# Patient Record
Sex: Male | Born: 1960
Health system: Southern US, Community
[De-identification: ages and names within clinical notes are randomized; demographics above are authoritative.]

## PROBLEM LIST (undated history)

## (undated) DIAGNOSIS — I341 Nonrheumatic mitral (valve) prolapse: Secondary | ICD-10-CM

## (undated) DIAGNOSIS — I38 Endocarditis, valve unspecified: Secondary | ICD-10-CM

## (undated) DIAGNOSIS — Z87442 Personal history of urinary calculi: Secondary | ICD-10-CM

## (undated) DIAGNOSIS — H409 Unspecified glaucoma: Secondary | ICD-10-CM

## (undated) DIAGNOSIS — Z9889 Other specified postprocedural states: Secondary | ICD-10-CM

## (undated) DIAGNOSIS — I4891 Unspecified atrial fibrillation: Secondary | ICD-10-CM

## (undated) DIAGNOSIS — I340 Nonrheumatic mitral (valve) insufficiency: Secondary | ICD-10-CM

## (undated) HISTORY — DX: Nonrheumatic mitral (valve) prolapse: I34.1

## (undated) HISTORY — PX: CARDIAC CATHETERIZATION: SHX172

## (undated) HISTORY — DX: Nonrheumatic mitral (valve) insufficiency: I34.0

## (undated) HISTORY — PX: TONSILLECTOMY: SUR1361

---
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
## 2008-01-15 ENCOUNTER — Encounter: Admission: RE | Admit: 2008-01-15 | Discharge: 2008-01-15 | Payer: Self-pay | Admitting: Internal Medicine

## 2008-02-20 ENCOUNTER — Encounter: Admission: RE | Admit: 2008-02-20 | Discharge: 2008-02-20 | Payer: Self-pay | Admitting: Internal Medicine

## 2008-02-20 ENCOUNTER — Other Ambulatory Visit: Admission: RE | Admit: 2008-02-20 | Discharge: 2008-02-20 | Payer: Self-pay | Admitting: Diagnostic Radiology

## 2008-02-20 ENCOUNTER — Encounter (INDEPENDENT_AMBULATORY_CARE_PROVIDER_SITE_OTHER): Payer: Self-pay | Admitting: Diagnostic Radiology

## 2012-04-13 ENCOUNTER — Emergency Department (HOSPITAL_COMMUNITY)

## 2012-04-13 ENCOUNTER — Emergency Department (HOSPITAL_COMMUNITY)
Admission: EM | Admit: 2012-04-13 | Discharge: 2012-04-13 | Disposition: A | Discharge status: Home / Self Care | Attending: Emergency Medicine | Admitting: Emergency Medicine

## 2012-04-13 ENCOUNTER — Encounter (HOSPITAL_COMMUNITY): Payer: Self-pay | Admitting: *Deleted

## 2012-04-13 DIAGNOSIS — S46919A Strain of unspecified muscle, fascia and tendon at shoulder and upper arm level, unspecified arm, initial encounter: Secondary | ICD-10-CM

## 2012-04-13 DIAGNOSIS — S4980XA Other specified injuries of shoulder and upper arm, unspecified arm, initial encounter: Secondary | ICD-10-CM | POA: Insufficient documentation

## 2012-04-13 DIAGNOSIS — S46909A Unspecified injury of unspecified muscle, fascia and tendon at shoulder and upper arm level, unspecified arm, initial encounter: Secondary | ICD-10-CM | POA: Insufficient documentation

## 2012-04-13 DIAGNOSIS — X500XXA Overexertion from strenuous movement or load, initial encounter: Secondary | ICD-10-CM | POA: Insufficient documentation

## 2012-04-13 HISTORY — DX: Endocarditis, valve unspecified: I38

## 2012-04-13 HISTORY — DX: Unspecified glaucoma: H40.9

## 2012-04-13 MED ORDER — HYDROCODONE-ACETAMINOPHEN 5-325 MG PO TABS: 1.0000 | ORAL_TABLET | Freq: Four times a day (QID) | ORAL | Status: DC | PRN

## 2012-04-13 NOTE — ED Notes (Signed)
Pt states he was pushing something at work and felt pain in his right shoulder; since then has had pain with raising arm and with any pressure

## 2012-04-13 NOTE — ED Provider Notes (Signed)
History     CSN: [id number]  Arrival date & time 04/13/12  8295   First MD Initiated Contact with Patient 04/13/12 0315      Chief Complaint  Patient presents with  . 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 He had sudden onset of severe aching pain in R shoulder, worse with range of motion. No falls or direct trauma.   Past Medical History  Diagnosis Date  . Heart valve problem   . Glaucoma     History reviewed. No pertinent past surgical history.  No family history on file.  History  Substance Use Topics  . Smoking status: Never Smoker   . Smokeless tobacco: Not on file  . Alcohol Use: No      Review of Systems All other systems reviewed and are negative except as noted in HPI.   Allergies  Review of patient's allergies indicates no known allergies.  Home Medications   Current Outpatient Rx  Name Route Sig Dispense Refill  . LATANOPROST 0.005 % OP SOLN Both Eyes Place 1 drop into both eyes at bedtime.      BP 139/86  Pulse 58  Temp 98.3 F (36.8 C) (Oral)  Resp 18  SpO2 100%  Physical Exam  Nursing note and vitals reviewed. Constitutional: He is oriented to person, place, and time. He appears well-developed and well-nourished.  HENT:  Head: Normocephalic and atraumatic.  Eyes: EOM are normal. Pupils are equal, round, and reactive to light.  Neck: Normal range of motion. Neck supple.  Cardiovascular: Normal rate, normal heart sounds and intact distal pulses.   Pulmonary/Chest: Effort normal and breath sounds normal.  Abdominal: Bowel sounds are normal. He exhibits no distension. There is no tenderness.  Musculoskeletal: He exhibits tenderness (no bony tenderness in R shoulder.). He exhibits no edema.       ROM limited by pain, unable to abduct arm beyond shoulder level  Neurological: He is  alert and oriented to person, place, and time. He has normal strength. No cranial nerve deficit or sensory deficit.  Skin: Skin is warm and dry. No rash noted.  Psychiatric: He has a normal mood and affect.    ED Course  Procedures (including critical care time)  Labs Reviewed - No data to display Dg Shoulder Right  04/13/2012  *RADIOLOGY REPORT*  Clinical Data: Shoulder injury with pain.  RIGHT SHOULDER - 2+ VIEW  Comparison: None.  Findings: Three-view study shows no evidence for fracture. Acromioclavicular and coracoclavicular distances are preserved.  No worrisome lytic or sclerotic osseous abnormality.  IMPRESSION: Normal exam.   Original Report Authenticated By: ERIC A. MANSELL, M.D.      No diagnosis found.    MDM  Likely ligamentous injury. Xray neg. Placed in sling, advised outpatient ortho followup.         Charles B. Bernette Mayers, MD 04/13/12 [phone]

## 2016-01-29 DIAGNOSIS — I059 Rheumatic mitral valve disease, unspecified: Secondary | ICD-10-CM | POA: Diagnosis not present

## 2016-01-29 DIAGNOSIS — R0602 Shortness of breath: Secondary | ICD-10-CM | POA: Diagnosis not present

## 2016-02-10 DIAGNOSIS — H401211 Low-tension glaucoma, right eye, mild stage: Secondary | ICD-10-CM | POA: Diagnosis not present

## 2016-02-10 DIAGNOSIS — H401222 Low-tension glaucoma, left eye, moderate stage: Secondary | ICD-10-CM | POA: Diagnosis not present

## 2016-02-16 DIAGNOSIS — I341 Nonrheumatic mitral (valve) prolapse: Secondary | ICD-10-CM | POA: Diagnosis not present

## 2016-02-16 DIAGNOSIS — R0602 Shortness of breath: Secondary | ICD-10-CM | POA: Diagnosis not present

## 2016-02-18 DIAGNOSIS — R0602 Shortness of breath: Secondary | ICD-10-CM | POA: Diagnosis not present

## 2016-02-18 DIAGNOSIS — I341 Nonrheumatic mitral (valve) prolapse: Secondary | ICD-10-CM | POA: Diagnosis not present

## 2016-03-22 DIAGNOSIS — Z85828 Personal history of other malignant neoplasm of skin: Secondary | ICD-10-CM | POA: Diagnosis not present

## 2016-03-22 DIAGNOSIS — B078 Other viral warts: Secondary | ICD-10-CM | POA: Diagnosis not present

## 2016-03-22 DIAGNOSIS — D1801 Hemangioma of skin and subcutaneous tissue: Secondary | ICD-10-CM | POA: Diagnosis not present

## 2016-03-22 DIAGNOSIS — L570 Actinic keratosis: Secondary | ICD-10-CM | POA: Diagnosis not present

## 2016-06-17 DIAGNOSIS — Z125 Encounter for screening for malignant neoplasm of prostate: Secondary | ICD-10-CM | POA: Diagnosis not present

## 2016-06-17 DIAGNOSIS — R635 Abnormal weight gain: Secondary | ICD-10-CM | POA: Diagnosis not present

## 2016-06-17 DIAGNOSIS — Z0000 Encounter for general adult medical examination without abnormal findings: Secondary | ICD-10-CM | POA: Diagnosis not present

## 2016-06-22 DIAGNOSIS — R3129 Other microscopic hematuria: Secondary | ICD-10-CM | POA: Diagnosis not present

## 2016-06-22 DIAGNOSIS — Z23 Encounter for immunization: Secondary | ICD-10-CM | POA: Diagnosis not present

## 2016-06-22 DIAGNOSIS — Z0000 Encounter for general adult medical examination without abnormal findings: Secondary | ICD-10-CM | POA: Diagnosis not present

## 2016-08-04 DIAGNOSIS — R3121 Asymptomatic microscopic hematuria: Secondary | ICD-10-CM | POA: Diagnosis not present

## 2016-08-09 DIAGNOSIS — R3121 Asymptomatic microscopic hematuria: Secondary | ICD-10-CM | POA: Diagnosis not present

## 2016-08-09 DIAGNOSIS — N200 Calculus of kidney: Secondary | ICD-10-CM | POA: Diagnosis not present

## 2016-08-16 DIAGNOSIS — H524 Presbyopia: Secondary | ICD-10-CM | POA: Diagnosis not present

## 2016-08-16 DIAGNOSIS — H401222 Low-tension glaucoma, left eye, moderate stage: Secondary | ICD-10-CM | POA: Diagnosis not present

## 2016-08-16 DIAGNOSIS — H401211 Low-tension glaucoma, right eye, mild stage: Secondary | ICD-10-CM | POA: Diagnosis not present

## 2016-08-16 DIAGNOSIS — H25043 Posterior subcapsular polar age-related cataract, bilateral: Secondary | ICD-10-CM | POA: Diagnosis not present

## 2016-08-19 DIAGNOSIS — I340 Nonrheumatic mitral (valve) insufficiency: Secondary | ICD-10-CM | POA: Diagnosis not present

## 2016-08-19 DIAGNOSIS — I059 Rheumatic mitral valve disease, unspecified: Secondary | ICD-10-CM | POA: Diagnosis not present

## 2016-08-30 DIAGNOSIS — I059 Rheumatic mitral valve disease, unspecified: Secondary | ICD-10-CM | POA: Diagnosis not present

## 2016-08-30 DIAGNOSIS — R0602 Shortness of breath: Secondary | ICD-10-CM | POA: Diagnosis not present

## 2016-09-22 DIAGNOSIS — I059 Rheumatic mitral valve disease, unspecified: Secondary | ICD-10-CM | POA: Diagnosis not present

## 2016-09-29 DIAGNOSIS — R3121 Asymptomatic microscopic hematuria: Secondary | ICD-10-CM | POA: Diagnosis not present

## 2016-09-29 DIAGNOSIS — N200 Calculus of kidney: Secondary | ICD-10-CM | POA: Diagnosis not present

## 2016-11-01 DIAGNOSIS — L570 Actinic keratosis: Secondary | ICD-10-CM | POA: Diagnosis not present

## 2017-02-21 DIAGNOSIS — H401222 Low-tension glaucoma, left eye, moderate stage: Secondary | ICD-10-CM | POA: Diagnosis not present

## 2017-02-21 DIAGNOSIS — H401211 Low-tension glaucoma, right eye, mild stage: Secondary | ICD-10-CM | POA: Diagnosis not present

## 2017-02-22 DIAGNOSIS — I059 Rheumatic mitral valve disease, unspecified: Secondary | ICD-10-CM | POA: Diagnosis not present

## 2017-03-07 DIAGNOSIS — R0602 Shortness of breath: Secondary | ICD-10-CM | POA: Diagnosis not present

## 2017-03-07 DIAGNOSIS — Z792 Long term (current) use of antibiotics: Secondary | ICD-10-CM | POA: Diagnosis not present

## 2017-03-07 DIAGNOSIS — I341 Nonrheumatic mitral (valve) prolapse: Secondary | ICD-10-CM | POA: Diagnosis not present

## 2017-03-23 DIAGNOSIS — L237 Allergic contact dermatitis due to plants, except food: Secondary | ICD-10-CM | POA: Diagnosis not present

## 2017-03-23 DIAGNOSIS — L723 Sebaceous cyst: Secondary | ICD-10-CM | POA: Diagnosis not present

## 2017-03-23 DIAGNOSIS — Z85828 Personal history of other malignant neoplasm of skin: Secondary | ICD-10-CM | POA: Diagnosis not present

## 2017-03-23 DIAGNOSIS — L570 Actinic keratosis: Secondary | ICD-10-CM | POA: Diagnosis not present

## 2017-03-23 DIAGNOSIS — L918 Other hypertrophic disorders of the skin: Secondary | ICD-10-CM | POA: Diagnosis not present

## 2017-03-28 DIAGNOSIS — N200 Calculus of kidney: Secondary | ICD-10-CM | POA: Diagnosis not present

## 2017-03-28 DIAGNOSIS — R3121 Asymptomatic microscopic hematuria: Secondary | ICD-10-CM | POA: Diagnosis not present

## 2017-06-22 DIAGNOSIS — Z0000 Encounter for general adult medical examination without abnormal findings: Secondary | ICD-10-CM | POA: Diagnosis not present

## 2017-06-22 DIAGNOSIS — Z125 Encounter for screening for malignant neoplasm of prostate: Secondary | ICD-10-CM | POA: Diagnosis not present

## 2017-06-28 DIAGNOSIS — Z125 Encounter for screening for malignant neoplasm of prostate: Secondary | ICD-10-CM | POA: Diagnosis not present

## 2017-07-07 DIAGNOSIS — R3129 Other microscopic hematuria: Secondary | ICD-10-CM | POA: Diagnosis not present

## 2017-07-07 DIAGNOSIS — Z23 Encounter for immunization: Secondary | ICD-10-CM | POA: Diagnosis not present

## 2017-07-07 DIAGNOSIS — Z0000 Encounter for general adult medical examination without abnormal findings: Secondary | ICD-10-CM | POA: Diagnosis not present

## 2017-07-08 DIAGNOSIS — R740 Nonspecific elevation of levels of transaminase and lactic acid dehydrogenase [LDH]: Secondary | ICD-10-CM | POA: Diagnosis not present

## 2017-08-04 DIAGNOSIS — R740 Nonspecific elevation of levels of transaminase and lactic acid dehydrogenase [LDH]: Secondary | ICD-10-CM | POA: Diagnosis not present

## 2017-08-05 DIAGNOSIS — K7689 Other specified diseases of liver: Secondary | ICD-10-CM | POA: Diagnosis not present

## 2017-08-19 DIAGNOSIS — K7689 Other specified diseases of liver: Secondary | ICD-10-CM | POA: Diagnosis not present

## 2017-08-19 DIAGNOSIS — M542 Cervicalgia: Secondary | ICD-10-CM | POA: Diagnosis not present

## 2017-08-19 DIAGNOSIS — I10 Essential (primary) hypertension: Secondary | ICD-10-CM | POA: Diagnosis not present

## 2017-08-29 DIAGNOSIS — H401222 Low-tension glaucoma, left eye, moderate stage: Secondary | ICD-10-CM | POA: Diagnosis not present

## 2017-08-29 DIAGNOSIS — H25043 Posterior subcapsular polar age-related cataract, bilateral: Secondary | ICD-10-CM | POA: Diagnosis not present

## 2017-08-29 DIAGNOSIS — H401211 Low-tension glaucoma, right eye, mild stage: Secondary | ICD-10-CM | POA: Diagnosis not present

## 2017-08-29 DIAGNOSIS — H524 Presbyopia: Secondary | ICD-10-CM | POA: Diagnosis not present

## 2017-08-30 DIAGNOSIS — I341 Nonrheumatic mitral (valve) prolapse: Secondary | ICD-10-CM | POA: Diagnosis not present

## 2017-09-05 DIAGNOSIS — R0602 Shortness of breath: Secondary | ICD-10-CM | POA: Diagnosis not present

## 2017-09-05 DIAGNOSIS — I341 Nonrheumatic mitral (valve) prolapse: Secondary | ICD-10-CM | POA: Diagnosis not present

## 2017-12-22 DIAGNOSIS — M545 Low back pain: Secondary | ICD-10-CM | POA: Diagnosis not present

## 2018-02-13 DIAGNOSIS — I10 Essential (primary) hypertension: Secondary | ICD-10-CM | POA: Diagnosis not present

## 2018-02-16 DIAGNOSIS — J45909 Unspecified asthma, uncomplicated: Secondary | ICD-10-CM | POA: Diagnosis not present

## 2018-02-16 DIAGNOSIS — J301 Allergic rhinitis due to pollen: Secondary | ICD-10-CM | POA: Diagnosis not present

## 2018-02-16 DIAGNOSIS — Z0000 Encounter for general adult medical examination without abnormal findings: Secondary | ICD-10-CM | POA: Diagnosis not present

## 2018-02-16 DIAGNOSIS — I10 Essential (primary) hypertension: Secondary | ICD-10-CM | POA: Diagnosis not present

## 2018-02-27 DIAGNOSIS — H401211 Low-tension glaucoma, right eye, mild stage: Secondary | ICD-10-CM | POA: Diagnosis not present

## 2018-02-27 DIAGNOSIS — H401222 Low-tension glaucoma, left eye, moderate stage: Secondary | ICD-10-CM | POA: Diagnosis not present

## 2018-03-03 DIAGNOSIS — I341 Nonrheumatic mitral (valve) prolapse: Secondary | ICD-10-CM | POA: Diagnosis not present

## 2018-03-03 DIAGNOSIS — R0602 Shortness of breath: Secondary | ICD-10-CM | POA: Diagnosis not present

## 2018-03-27 DIAGNOSIS — N200 Calculus of kidney: Secondary | ICD-10-CM | POA: Diagnosis not present

## 2018-03-27 DIAGNOSIS — N400 Enlarged prostate without lower urinary tract symptoms: Secondary | ICD-10-CM | POA: Diagnosis not present

## 2018-03-29 DIAGNOSIS — L814 Other melanin hyperpigmentation: Secondary | ICD-10-CM | POA: Diagnosis not present

## 2018-03-29 DIAGNOSIS — Z85828 Personal history of other malignant neoplasm of skin: Secondary | ICD-10-CM | POA: Diagnosis not present

## 2018-03-29 DIAGNOSIS — L570 Actinic keratosis: Secondary | ICD-10-CM | POA: Diagnosis not present

## 2018-03-29 DIAGNOSIS — L718 Other rosacea: Secondary | ICD-10-CM | POA: Diagnosis not present

## 2018-05-29 DIAGNOSIS — H401211 Low-tension glaucoma, right eye, mild stage: Secondary | ICD-10-CM | POA: Diagnosis not present

## 2018-05-29 DIAGNOSIS — H401222 Low-tension glaucoma, left eye, moderate stage: Secondary | ICD-10-CM | POA: Diagnosis not present

## 2018-08-01 ENCOUNTER — Other Ambulatory Visit: Payer: Self-pay | Admitting: Cardiology

## 2018-08-01 DIAGNOSIS — I340 Nonrheumatic mitral (valve) insufficiency: Secondary | ICD-10-CM

## 2018-08-16 ENCOUNTER — Ambulatory Visit: Payer: Federal, State, Local not specified - PPO

## 2018-08-16 ENCOUNTER — Other Ambulatory Visit: Payer: Self-pay

## 2018-08-16 DIAGNOSIS — I340 Nonrheumatic mitral (valve) insufficiency: Secondary | ICD-10-CM

## 2018-08-21 DIAGNOSIS — I10 Essential (primary) hypertension: Secondary | ICD-10-CM | POA: Diagnosis not present

## 2018-08-21 DIAGNOSIS — Z125 Encounter for screening for malignant neoplasm of prostate: Secondary | ICD-10-CM | POA: Diagnosis not present

## 2018-08-22 DIAGNOSIS — Z298 Encounter for other specified prophylactic measures: Secondary | ICD-10-CM | POA: Insufficient documentation

## 2018-08-22 DIAGNOSIS — R0600 Dyspnea, unspecified: Secondary | ICD-10-CM | POA: Insufficient documentation

## 2018-08-22 DIAGNOSIS — R0609 Other forms of dyspnea: Secondary | ICD-10-CM | POA: Insufficient documentation

## 2018-08-22 DIAGNOSIS — I340 Nonrheumatic mitral (valve) insufficiency: Secondary | ICD-10-CM | POA: Insufficient documentation

## 2018-08-22 DIAGNOSIS — I341 Nonrheumatic mitral (valve) prolapse: Secondary | ICD-10-CM | POA: Insufficient documentation

## 2018-08-22 NOTE — H&P (View-Only) (Signed)
Subjective:   @Patient  ID@: Derrick Craig, male    DOB: 1961-01-28, 58 y.o.   MRN: [id number]  Pearson GrippeKim, James, MD:  No chief complaint on file.   HPI   Derrick Craig is a pleasant 58 year old Caucasian male with history of mitral valve prolapse and moderate to severe MR that is essentially asymptomatic.  Recently underwent echocardiogram on 08/15/2018 that was unchanged from before, except for now LV dilation.Patient is here for 6 month office visit for follow up on mitral regurgitation and also to discuss recent echo results.   He states that he is been doing well and has not had any significant symptoms. He continues to have mild exertional dyspnea with extremes of exertion while lifting heavy objects at work place. No PND or orthopnea. He denies any chest pain or palpitations. No fever, chills.Reports gaining weight since his Aunt passed away.   Past Medical History:  Diagnosis Date  . Glaucoma   . Heart valve problem   . MVP (mitral valve prolapse)     Past Surgical History:  Procedure Laterality Date  . TONSILLECTOMY      Family History  Problem Relation Age of Onset  . Heart failure Mother     Social History   Socioeconomic History  . Marital status: Single    Spouse name: Not on file  . Number of children: Not on file  . Years of education: Not on file  . Highest education level: Not on file  Occupational History  . Not on file  Social Needs  . Financial resource strain: Not on file  . Food insecurity:    Worry: Not on file    Inability: Not on file  . Transportation needs:    Medical: Not on file    Non-medical: Not on file  Tobacco Use  . Smoking status: Never Smoker  . Smokeless tobacco: Never Used  Substance and Sexual Activity  . Alcohol use: No  . Drug use: Not on file  . Sexual activity: Not on file  Lifestyle  . Physical activity:    Days per week: Not on file    Minutes per session: Not on file  . Stress: Not on file    Relationships  . Social connections:    Talks on phone: Not on file    Gets together: Not on file    Attends religious service: Not on file    Active member of club or organization: Not on file    Attends meetings of clubs or organizations: Not on file    Relationship status: Not on file  . Intimate partner violence:    Fear of current or ex partner: Not on file    Emotionally abused: Not on file    Physically abused: Not on file    Forced sexual activity: Not on file  Other Topics Concern  . Not on file  Social History Narrative  . Not on file    Current Outpatient Medications on File Prior to Visit  Medication Sig Dispense Refill  . HYDROcodone-acetaminophen (NORCO/VICODIN) 5-325 MG per tablet Take 1-2 tablets by mouth every 6 (six) hours as needed for pain. 30 tablet 0  . latanoprost (XALATAN) 0.005 % ophthalmic solution Place 1 drop into both eyes at bedtime.     No current facility-administered medications on file prior to visit.      Review of Systems  Constitution: Negative for decreased appetite, malaise/fatigue, weight gain and weight loss.  Eyes:  Negative for visual disturbance.  Cardiovascular: Positive for dyspnea on exertion (with extreme exertion). Negative for chest pain, claudication, leg swelling, orthopnea, palpitations and syncope.  Respiratory: Negative for hemoptysis and wheezing.   Endocrine: Negative for cold intolerance and heat intolerance.  Hematologic/Lymphatic: Does not bruise/bleed easily.  Skin: Negative for nail changes.  Musculoskeletal: Negative for muscle weakness and myalgias.  Gastrointestinal: Negative for abdominal pain, change in bowel habit, nausea and vomiting.  Neurological: Negative for difficulty with concentration, dizziness, focal weakness and headaches.  Psychiatric/Behavioral: Negative for altered mental status and suicidal ideas.  All other systems reviewed and are negative.      Objective:     Blood pressure 111/69,  pulse (!) 58, height 6' (1.829 m), weight 205 lb 1.6 oz (93 kg), SpO2 99 %.   Echocardiogram 08/15/2018: Left ventricle cavity is mildly dilated at 6.1 cm and LVIDS is normal at 41.cm. Mild concentric hypertrophy of the left ventricle. Normal global wall motion. Normal diastolic filling pattern. Calculated EF 61%. Left atrial cavity is modearely dilated by volume at 52mL and measures 4cm in long axis. The interatrial Septum is thin and mobile but appears to be intact by 2D and CF Doppler interrogation. Trace aortic regurgitation. Moderately severe posteriorly directed (Grade III) mitral regurgitation. Moderate mitral valve prolapse with symmetric leaflets. Mild Myxomatous degeneration of the MV leaflets. Trace tricuspid regurgitation. Inadequate tricuspid regurgitation jet to estimate pulmonary artery pressure. To the study done on 08/30/2017: Left ventricle is now mildly dilated compared to normal.  Treadmill stress test [02/16/2016]: Indications: Screening for CAD, Mitral regurgitation. The resting electrocardiogram demonstrated normal sinus rhythm, normal resting conduction, no resting arrhythmias and normal rest repolarization. The stress electrocardiogram was normal. There were no significant arrhythmias. Rare PVC. Patient exercised on Bruce protocol for 13.00 minutes and achieved 114 % of Max Predicted HR (Target HR was >85% MPHR) and 14.16 METS. Stress symptoms included fatigue. Normal BP response. Exercise capacity was excellent . Impression: Normal stress EKG. No significant arrhythmias. Normal BP response.   Physical Exam  Constitutional: He is oriented to person, place, and time. Vital signs are normal. He appears well-developed and well-nourished.  HENT:  Head: Normocephalic and atraumatic.  Neck: Normal range of motion.  Cardiovascular: Normal rate, regular rhythm and intact distal pulses.  Murmur heard. High-pitched blowing holosystolic murmur is present at the apex.  Mid-systolic click Pulmonary/Chest: Effort normal and breath sounds normal. No accessory muscle usage. No respiratory distress.  Abdominal: Soft. Bowel sounds are normal.  Musculoskeletal: Normal range of motion.  Neurological: He is alert and oriented to person, place, and time.  Skin: Skin is warm and dry.  Vitals reviewed.          Assessment & Recommendations:   1. Severe mitral valve regurgitation Symptomatically has minimal symptoms that are stable; however, in view of LV dilation that is new, feel that it would be best to proceed with MV replacement. This was discussed in detail with the patient. I will refer to CV surgery for minimally invasive procedure unless he is not a candidate. He will need TEE to confirm severity and for further evaluation. Will also schedule for right and left heart catheterization for pre-surgery evaluation to ensure no significant CAD. Schedule for cardiac catheterization, and possible angioplasty. We discussed regarding risks, benefits, alternatives to this including stress testing, CTA and continued medical therapy. Patient wants to proceed. Understands <1-2% risk of death, stroke, MI, urgent CABG, bleeding, infection, renal failure but not limited to these.  Will  arrange for after TEE.   2. Mitral valve prolapse As stated above, will need mitral valve replacement and will place referral to CV surgery.   3. Dyspnea on exertion Stable. Occurs with extreme exertion.  4. Indication present for endocarditis prophylaxis He is aware of need for antibiotic prophylaxis.   I will see him back after surgery for follow up.   *I have discussed this case with Dr. Jacinto Halim and he personally examined the patient and participated in formulating the plan.*    Altamese Las Lomas, FNP-C [facility] Office: [phone] Fax: [phone]

## 2018-08-22 NOTE — Progress Notes (Signed)
    Subjective:   @Patient ID@: Derrick Craig, male    DOB: 10/24/1960, 58 y.o.   MRN: [id number]  Kim, James, MD:  No chief complaint on file.   HPI   Derrick Craig is a pleasant 58 year old Caucasian male with history of mitral valve prolapse and moderate to severe MR that is essentially asymptomatic.  Recently underwent echocardiogram on 08/15/2018 that was unchanged from before, except for now LV dilation.Patient is here for 6 month office visit for follow up on mitral regurgitation and also to discuss recent echo results.   He states that he is been doing well and has not had any significant symptoms. He continues to have mild exertional dyspnea with extremes of exertion while lifting heavy objects at work place. No PND or orthopnea. He denies any chest pain or palpitations. No fever, chills.Reports gaining weight since his Aunt passed away.   Past Medical History:  Diagnosis Date  . Glaucoma   . Heart valve problem   . MVP (mitral valve prolapse)     Past Surgical History:  Procedure Laterality Date  . TONSILLECTOMY      Family History  Problem Relation Age of Onset  . Heart failure Mother     Social History   Socioeconomic History  . Marital status: Single    Spouse name: Not on file  . Number of children: Not on file  . Years of education: Not on file  . Highest education level: Not on file  Occupational History  . Not on file  Social Needs  . Financial resource strain: Not on file  . Food insecurity:    Worry: Not on file    Inability: Not on file  . Transportation needs:    Medical: Not on file    Non-medical: Not on file  Tobacco Use  . Smoking status: Never Smoker  . Smokeless tobacco: Never Used  Substance and Sexual Activity  . Alcohol use: No  . Drug use: Not on file  . Sexual activity: Not on file  Lifestyle  . Physical activity:    Days per week: Not on file    Minutes per session: Not on file  . Stress: Not on file    Relationships  . Social connections:    Talks on phone: Not on file    Gets together: Not on file    Attends religious service: Not on file    Active member of club or organization: Not on file    Attends meetings of clubs or organizations: Not on file    Relationship status: Not on file  . Intimate partner violence:    Fear of current or ex partner: Not on file    Emotionally abused: Not on file    Physically abused: Not on file    Forced sexual activity: Not on file  Other Topics Concern  . Not on file  Social History Narrative  . Not on file    Current Outpatient Medications on File Prior to Visit  Medication Sig Dispense Refill  . HYDROcodone-acetaminophen (NORCO/VICODIN) 5-325 MG per tablet Take 1-2 tablets by mouth every 6 (six) hours as needed for pain. 30 tablet 0  . latanoprost (XALATAN) 0.005 % ophthalmic solution Place 1 drop into both eyes at bedtime.     No current facility-administered medications on file prior to visit.      Review of Systems  Constitution: Negative for decreased appetite, malaise/fatigue, weight gain and weight loss.  Eyes:   Negative for visual disturbance.  Cardiovascular: Positive for dyspnea on exertion (with extreme exertion). Negative for chest pain, claudication, leg swelling, orthopnea, palpitations and syncope.  Respiratory: Negative for hemoptysis and wheezing.   Endocrine: Negative for cold intolerance and heat intolerance.  Hematologic/Lymphatic: Does not bruise/bleed easily.  Skin: Negative for nail changes.  Musculoskeletal: Negative for muscle weakness and myalgias.  Gastrointestinal: Negative for abdominal pain, change in bowel habit, nausea and vomiting.  Neurological: Negative for difficulty with concentration, dizziness, focal weakness and headaches.  Psychiatric/Behavioral: Negative for altered mental status and suicidal ideas.  All other systems reviewed and are negative.      Objective:     Blood pressure 111/69,  pulse (!) 58, height 6' (1.829 m), weight 205 lb 1.6 oz (93 kg), SpO2 99 %.   Echocardiogram 08/15/2018: Left ventricle cavity is mildly dilated at 6.1 cm and LVIDS is normal at 41.cm. Mild concentric hypertrophy of the left ventricle. Normal global wall motion. Normal diastolic filling pattern. Calculated EF 61%. Left atrial cavity is modearely dilated by volume at 52mL and measures 4cm in long axis. The interatrial Septum is thin and mobile but appears to be intact by 2D and CF Doppler interrogation. Trace aortic regurgitation. Moderately severe posteriorly directed (Grade III) mitral regurgitation. Moderate mitral valve prolapse with symmetric leaflets. Mild Myxomatous degeneration of the MV leaflets. Trace tricuspid regurgitation. Inadequate tricuspid regurgitation jet to estimate pulmonary artery pressure. To the study done on 08/30/2017: Left ventricle is now mildly dilated compared to normal.  Treadmill stress test [02/16/2016]: Indications: Screening for CAD, Mitral regurgitation. The resting electrocardiogram demonstrated normal sinus rhythm, normal resting conduction, no resting arrhythmias and normal rest repolarization. The stress electrocardiogram was normal. There were no significant arrhythmias. Rare PVC. Patient exercised on Bruce protocol for 13.00 minutes and achieved 114 % of Max Predicted HR (Target HR was >85% MPHR) and 14.16 METS. Stress symptoms included fatigue. Normal BP response. Exercise capacity was excellent . Impression: Normal stress EKG. No significant arrhythmias. Normal BP response.   Physical Exam  Constitutional: He is oriented to person, place, and time. Vital signs are normal. He appears well-developed and well-nourished.  HENT:  Head: Normocephalic and atraumatic.  Neck: Normal range of motion.  Cardiovascular: Normal rate, regular rhythm and intact distal pulses.  Murmur heard. High-pitched blowing holosystolic murmur is present at the apex.  Mid-systolic click Pulmonary/Chest: Effort normal and breath sounds normal. No accessory muscle usage. No respiratory distress.  Abdominal: Soft. Bowel sounds are normal.  Musculoskeletal: Normal range of motion.  Neurological: He is alert and oriented to person, place, and time.  Skin: Skin is warm and dry.  Vitals reviewed.          Assessment & Recommendations:   1. Severe mitral valve regurgitation Symptomatically has minimal symptoms that are stable; however, in view of LV dilation that is new, feel that it would be best to proceed with MV replacement. This was discussed in detail with the patient. I will refer to CV surgery for minimally invasive procedure unless he is not a candidate. He will need TEE to confirm severity and for further evaluation. Will also schedule for right and left heart catheterization for pre-surgery evaluation to ensure no significant CAD. Schedule for cardiac catheterization, and possible angioplasty. We discussed regarding risks, benefits, alternatives to this including stress testing, CTA and continued medical therapy. Patient wants to proceed. Understands <1-2% risk of death, stroke, MI, urgent CABG, bleeding, infection, renal failure but not limited to these.  Will   arrange for after TEE.   2. Mitral valve prolapse As stated above, will need mitral valve replacement and will place referral to CV surgery.   3. Dyspnea on exertion Stable. Occurs with extreme exertion.  4. Indication present for endocarditis prophylaxis He is aware of need for antibiotic prophylaxis.   I will see him back after surgery for follow up.   *I have discussed this case with Dr. Ganji and he personally examined the patient and participated in formulating the plan.*    Catalaya Garr, FNP-C [facility] Office: [phone] Fax: [phone] 

## 2018-08-23 ENCOUNTER — Encounter: Payer: Self-pay | Admitting: Cardiology

## 2018-08-23 ENCOUNTER — Ambulatory Visit: Payer: Federal, State, Local not specified - PPO | Admitting: Cardiology

## 2018-08-23 DIAGNOSIS — Z298 Encounter for other specified prophylactic measures: Secondary | ICD-10-CM | POA: Diagnosis not present

## 2018-08-23 DIAGNOSIS — R0600 Dyspnea, unspecified: Secondary | ICD-10-CM

## 2018-08-23 DIAGNOSIS — I341 Nonrheumatic mitral (valve) prolapse: Secondary | ICD-10-CM

## 2018-08-23 DIAGNOSIS — I340 Nonrheumatic mitral (valve) insufficiency: Secondary | ICD-10-CM

## 2018-08-23 DIAGNOSIS — R0609 Other forms of dyspnea: Secondary | ICD-10-CM

## 2018-08-28 ENCOUNTER — Other Ambulatory Visit: Payer: Self-pay | Admitting: Cardiology

## 2018-08-28 DIAGNOSIS — Z0000 Encounter for general adult medical examination without abnormal findings: Secondary | ICD-10-CM | POA: Diagnosis not present

## 2018-08-28 DIAGNOSIS — I341 Nonrheumatic mitral (valve) prolapse: Secondary | ICD-10-CM | POA: Diagnosis not present

## 2018-08-29 LAB — BASIC METABOLIC PANEL
BUN/Creatinine Ratio: 19 (ref 9–20)
BUN: 18 mg/dL (ref 6–24)
CALCIUM: 9.2 mg/dL (ref 8.7–10.2)
CO2: 25 mmol/L (ref 20–29)
CREATININE: 0.94 mg/dL (ref 0.76–1.27)
Chloride: 103 mmol/L (ref 96–106)
GFR calc Af Amer: 105 mL/min/{1.73_m2} (ref >59)
GFR, EST NON AFRICAN AMERICAN: 91 mL/min/{1.73_m2} (ref >59)
Glucose: 100 mg/dL — ABNORMAL HIGH (ref 65–99)
POTASSIUM: 4 mmol/L (ref 3.5–5.2)
Sodium: 141 mmol/L (ref 134–144)

## 2018-08-29 LAB — CBC WITH DIFFERENTIAL/PLATELET
Basophils Absolute: 0 10*3/uL (ref 0.0–0.2)
Basos: 1 %
EOS (ABSOLUTE): 0.2 10*3/uL (ref 0.0–0.4)
EOS: 3 %
HEMATOCRIT: 44.1 % (ref 37.5–51.0)
HEMOGLOBIN: 15.1 g/dL (ref 13.0–17.7)
IMMATURE GRANULOCYTES: 0 %
Immature Grans (Abs): 0 10*3/uL (ref 0.0–0.1)
Lymphocytes Absolute: 1.9 10*3/uL (ref 0.7–3.1)
Lymphs: 36 %
MCH: 32.3 pg (ref 26.6–33.0)
MCHC: 34.2 g/dL (ref 31.5–35.7)
MCV: 94 fL (ref 79–97)
MONOCYTES: 10 %
Monocytes Absolute: 0.5 10*3/uL (ref 0.1–0.9)
NEUTROS PCT: 50 %
Neutrophils Absolute: 2.6 10*3/uL (ref 1.4–7.0)
Platelets: 250 10*3/uL (ref 150–450)
RBC: 4.68 x10E6/uL (ref 4.14–5.80)
RDW: 12.6 % (ref 11.6–15.4)
WBC: 5.2 10*3/uL (ref 3.4–10.8)

## 2018-09-05 ENCOUNTER — Encounter (HOSPITAL_COMMUNITY)
Admission: RE | Disposition: A | Discharge status: Home / Self Care | Payer: Self-pay | Source: Home / Self Care | Attending: Cardiology

## 2018-09-05 ENCOUNTER — Ambulatory Visit (HOSPITAL_COMMUNITY)
Admission: RE | Admit: 2018-09-05 | Discharge: 2018-09-05 | Disposition: A | Discharge status: Home / Self Care | Payer: Federal, State, Local not specified - PPO | Attending: Cardiology | Admitting: Cardiology

## 2018-09-05 ENCOUNTER — Other Ambulatory Visit: Payer: Self-pay

## 2018-09-05 ENCOUNTER — Encounter (HOSPITAL_COMMUNITY): Payer: Self-pay | Admitting: *Deleted

## 2018-09-05 ENCOUNTER — Ambulatory Visit (HOSPITAL_COMMUNITY): Payer: Federal, State, Local not specified - PPO

## 2018-09-05 ENCOUNTER — Other Ambulatory Visit (HOSPITAL_COMMUNITY): Payer: Self-pay

## 2018-09-05 DIAGNOSIS — Q211 Atrial septal defect: Secondary | ICD-10-CM | POA: Insufficient documentation

## 2018-09-05 DIAGNOSIS — I420 Dilated cardiomyopathy: Secondary | ICD-10-CM | POA: Insufficient documentation

## 2018-09-05 DIAGNOSIS — I341 Nonrheumatic mitral (valve) prolapse: Secondary | ICD-10-CM | POA: Diagnosis present

## 2018-09-05 DIAGNOSIS — Z8249 Family history of ischemic heart disease and other diseases of the circulatory system: Secondary | ICD-10-CM | POA: Diagnosis not present

## 2018-09-05 DIAGNOSIS — I340 Nonrheumatic mitral (valve) insufficiency: Secondary | ICD-10-CM | POA: Diagnosis not present

## 2018-09-05 DIAGNOSIS — R0609 Other forms of dyspnea: Secondary | ICD-10-CM | POA: Diagnosis not present

## 2018-09-05 DIAGNOSIS — H409 Unspecified glaucoma: Secondary | ICD-10-CM | POA: Diagnosis not present

## 2018-09-05 HISTORY — PX: TEE WITHOUT CARDIOVERSION: SHX5443

## 2018-09-05 HISTORY — PX: RIGHT/LEFT HEART CATH AND CORONARY ANGIOGRAPHY: CATH118266

## 2018-09-05 LAB — POCT I-STAT EG7
Bicarbonate: 25.8 mmol/L (ref 20.0–28.0)
Bicarbonate: 26.1 mmol/L (ref 20.0–28.0)
Bicarbonate: 26.4 mmol/L (ref 20.0–28.0)
Bicarbonate: 25.8 mmol/L (ref 20.0–28.0)
CALCIUM ION: 1.21 mmol/L (ref 1.15–1.40)
Calcium, Ion: 1.19 mmol/L (ref 1.15–1.40)
Calcium, Ion: 1.21 mmol/L (ref 1.15–1.40)
Calcium, Ion: 1.11 mmol/L — ABNORMAL LOW (ref 1.15–1.40)
HCT: 38 % — ABNORMAL LOW (ref 39.0–52.0)
HCT: 39 % (ref 39.0–52.0)
HCT: 37 % — ABNORMAL LOW (ref 39.0–52.0)
HEMATOCRIT: 38 % — AB (ref 39.0–52.0)
HEMOGLOBIN: 12.9 g/dL — AB (ref 13.0–17.0)
HEMOGLOBIN: 12.6 g/dL — AB (ref 13.0–17.0)
Hemoglobin: 12.9 g/dL — ABNORMAL LOW (ref 13.0–17.0)
Hemoglobin: 13.3 g/dL (ref 13.0–17.0)
O2 Saturation: 76 %
O2 Saturation: 85 %
O2 Saturation: 98 %
O2 Saturation: 79 %
PO2 VEN: 44 mmHg (ref 32.0–45.0)
Potassium: 4.1 mmol/L (ref 3.5–5.1)
Potassium: 4.1 mmol/L (ref 3.5–5.1)
Potassium: 4.2 mmol/L (ref 3.5–5.1)
Potassium: 3.9 mmol/L (ref 3.5–5.1)
Sodium: 139 mmol/L (ref 135–145)
Sodium: 140 mmol/L (ref 135–145)
Sodium: 140 mmol/L (ref 135–145)
Sodium: 141 mmol/L (ref 135–145)
TCO2: 27 mmol/L (ref 22–32)
TCO2: 28 mmol/L (ref 22–32)
TCO2: 28 mmol/L (ref 22–32)
TCO2: 27 mmol/L (ref 22–32)
pCO2, Ven: 46.6 mmHg (ref 44.0–60.0)
pCO2, Ven: 48.1 mmHg (ref 44.0–60.0)
pCO2, Ven: 49 mmHg (ref 44.0–60.0)
pCO2, Ven: 47.7 mmHg (ref 44.0–60.0)
pH, Ven: 7.333 (ref 7.250–7.430)
pH, Ven: 7.348 (ref 7.250–7.430)
pH, Ven: 7.351 (ref 7.250–7.430)
pH, Ven: 7.341 (ref 7.250–7.430)
pO2, Ven: 102 mmHg — ABNORMAL HIGH (ref 32.0–45.0)
pO2, Ven: 53 mmHg — ABNORMAL HIGH (ref 32.0–45.0)
pO2, Ven: 46 mmHg — ABNORMAL HIGH (ref 32.0–45.0)

## 2018-09-05 LAB — POCT I-STAT 7, (LYTES, BLD GAS, ICA,H+H)
BICARBONATE: 26.5 mmol/L (ref 20.0–28.0)
Calcium, Ion: 1.19 mmol/L (ref 1.15–1.40)
HCT: 37 % — ABNORMAL LOW (ref 39.0–52.0)
Hemoglobin: 12.6 g/dL — ABNORMAL LOW (ref 13.0–17.0)
O2 Saturation: 98 %
PH ART: 7.354 (ref 7.350–7.450)
POTASSIUM: 4.1 mmol/L (ref 3.5–5.1)
Sodium: 139 mmol/L (ref 135–145)
TCO2: 28 mmol/L (ref 22–32)
pCO2 arterial: 47.5 mmHg (ref 32.0–48.0)
pO2, Arterial: 111 mmHg — ABNORMAL HIGH (ref 83.0–108.0)

## 2018-09-05 SURGERY — RIGHT/LEFT HEART CATH AND CORONARY ANGIOGRAPHY
Anesthesia: LOCAL

## 2018-09-05 SURGERY — ECHOCARDIOGRAM, TRANSESOPHAGEAL
Anesthesia: Moderate Sedation

## 2018-09-05 MED ORDER — MIDAZOLAM HCL (PF) 5 MG/ML IJ SOLN
INTRAMUSCULAR | Status: AC
  Filled 2018-09-05: qty 2

## 2018-09-05 MED ORDER — VERAPAMIL HCL 2.5 MG/ML IV SOLN
INTRAVENOUS | Status: DC | PRN
  Administered 2018-09-05: 10 mL via INTRA_ARTERIAL

## 2018-09-05 MED ORDER — HEPARIN (PORCINE) IN NACL 1000-0.9 UT/500ML-% IV SOLN
INTRAVENOUS | Status: AC
  Filled 2018-09-05: qty 500

## 2018-09-05 MED ORDER — FENTANYL CITRATE (PF) 100 MCG/2ML IJ SOLN
INTRAMUSCULAR | Status: AC
  Filled 2018-09-05: qty 2

## 2018-09-05 MED ORDER — HEPARIN (PORCINE) IN NACL 1000-0.9 UT/500ML-% IV SOLN
INTRAVENOUS | Status: AC
  Filled 2018-09-05: qty 1000

## 2018-09-05 MED ORDER — HEPARIN SODIUM (PORCINE) 1000 UNIT/ML IJ SOLN
INTRAMUSCULAR | Status: DC | PRN
  Administered 2018-09-05: 4500 [IU] via INTRAVENOUS

## 2018-09-05 MED ORDER — FENTANYL CITRATE (PF) 100 MCG/2ML IJ SOLN
INTRAMUSCULAR | Status: DC | PRN
  Administered 2018-09-05: 25 ug via INTRAVENOUS

## 2018-09-05 MED ORDER — HEPARIN (PORCINE) IN NACL 1000-0.9 UT/500ML-% IV SOLN
INTRAVENOUS | Status: DC | PRN
  Administered 2018-09-05 (×3): 500 mL

## 2018-09-05 MED ORDER — HEPARIN SODIUM (PORCINE) 1000 UNIT/ML IJ SOLN
INTRAMUSCULAR | Status: AC
  Filled 2018-09-05: qty 1

## 2018-09-05 MED ORDER — MIDAZOLAM HCL 2 MG/2ML IJ SOLN
INTRAMUSCULAR | Status: AC
  Filled 2018-09-05: qty 2

## 2018-09-05 MED ORDER — IOHEXOL 350 MG/ML SOLN
INTRAVENOUS | Status: DC | PRN
  Administered 2018-09-05: 15 mL via INTRAVENOUS

## 2018-09-05 MED ORDER — LIDOCAINE HCL (PF) 1 % IJ SOLN
INTRAMUSCULAR | Status: DC | PRN
  Administered 2018-09-05: 2 mL

## 2018-09-05 MED ORDER — LIDOCAINE HCL (PF) 1 % IJ SOLN
INTRAMUSCULAR | Status: AC
  Filled 2018-09-05: qty 30

## 2018-09-05 MED ORDER — SODIUM CHLORIDE 0.9% FLUSH: 3.0000 mL | Freq: Two times a day (BID) | INTRAVENOUS | Status: DC

## 2018-09-05 MED ORDER — SODIUM CHLORIDE 0.9 % IV SOLN: 250.0000 mL | INTRAVENOUS | Status: DC | PRN

## 2018-09-05 MED ORDER — SODIUM CHLORIDE 0.9 % IV SOLN: INTRAVENOUS | Status: AC

## 2018-09-05 MED ORDER — VERAPAMIL HCL 2.5 MG/ML IV SOLN
INTRAVENOUS | Status: AC
  Filled 2018-09-05: qty 2

## 2018-09-05 MED ORDER — ONDANSETRON HCL 4 MG/2ML IJ SOLN: 4.0000 mg | Freq: Four times a day (QID) | INTRAMUSCULAR | Status: DC | PRN

## 2018-09-05 MED ORDER — SODIUM CHLORIDE 0.9% FLUSH: 3.0000 mL | INTRAVENOUS | Status: DC | PRN

## 2018-09-05 MED ORDER — MIDAZOLAM HCL (PF) 10 MG/2ML IJ SOLN
INTRAMUSCULAR | Status: DC | PRN
  Administered 2018-09-05: 2 mg via INTRAVENOUS

## 2018-09-05 MED ORDER — SODIUM CHLORIDE 0.9 % WEIGHT BASED INFUSION: 1.0000 mL/kg/h | INTRAVENOUS | Status: DC

## 2018-09-05 MED ORDER — SODIUM CHLORIDE 0.9 % IV SOLN
INTRAVENOUS | Status: AC | PRN
  Administered 2018-09-05: 10 mL/h via INTRAVENOUS

## 2018-09-05 MED ORDER — SODIUM CHLORIDE 0.9 % IV SOLN
INTRAVENOUS | Status: DC
  Administered 2018-09-05: 10:00:00 via INTRAVENOUS

## 2018-09-05 MED ORDER — ACETAMINOPHEN 325 MG PO TABS: 650.0000 mg | ORAL_TABLET | ORAL | Status: DC | PRN

## 2018-09-05 MED ORDER — SODIUM CHLORIDE 0.9 % WEIGHT BASED INFUSION: 3.0000 mL/kg/h | INTRAVENOUS | Status: AC

## 2018-09-05 MED ORDER — BUTAMBEN-TETRACAINE-BENZOCAINE 2-2-14 % EX AERO
INHALATION_SPRAY | CUTANEOUS | Status: DC | PRN
  Administered 2018-09-05: 2 via TOPICAL

## 2018-09-05 MED ORDER — MIDAZOLAM HCL 2 MG/2ML IJ SOLN
INTRAMUSCULAR | Status: DC | PRN
  Administered 2018-09-05: 1 mg via INTRAVENOUS

## 2018-09-05 SURGICAL SUPPLY — 12 items
CATH BALLN WEDGE 5F 110CM (CATHETERS) ×1 IMPLANT
CATH OPTITORQUE TIG 4.0 5F (CATHETERS) ×1 IMPLANT
DEVICE RAD COMP TR BAND LRG (VASCULAR PRODUCTS) ×1 IMPLANT
GLIDESHEATH SLEND A-KIT 6F 22G (SHEATH) ×1 IMPLANT
GUIDEWIRE .025 260CM (WIRE) ×1 IMPLANT
GUIDEWIRE INQWIRE 1.5J.035X260 (WIRE) IMPLANT
INQWIRE 1.5J .035X260CM (WIRE) ×2
KIT HEART LEFT (KITS) ×2 IMPLANT
PACK CARDIAC CATHETERIZATION (CUSTOM PROCEDURE TRAY) ×2 IMPLANT
SHEATH GLIDE SLENDER 4/5FR (SHEATH) ×1 IMPLANT
TRANSDUCER W/STOPCOCK (MISCELLANEOUS) ×2 IMPLANT
TUBING CIL FLEX 10 FLL-RA (TUBING) ×2 IMPLANT

## 2018-09-05 NOTE — Interval H&P Note (Signed)
History and Physical Interval Note:  09/05/2018 10:53 AM  Derrick Craig.  has presented today for surgery, with the diagnosis of mr  The various methods of treatment have been discussed with the patient and family. After consideration of risks, benefits and other options for treatment, the patient has consented to  Procedure(s): RIGHT/LEFT HEART CATH AND CORONARY ANGIOGRAPHY (N/A) as a surgical intervention .  The patient's history has been reviewed, patient examined, no change in status, stable for surgery.  I have reviewed the patient's chart and labs.  Questions were answered to the patient's satisfaction.     [name]

## 2018-09-05 NOTE — CV Procedure (Signed)
TEE: Under moderate sedation, TEE was performed without complications: LV: Mildly dilated. Normal EF. RV: Borderline dilated.  LA: Mild to moderately dilated. Left atrial appendage: Normal without thrombus. Normal function. Inter atrial septum shows a very small PFO by color Doppler exam.  RA: Normal  MV: Prolapse of both anterior  And posterior leaflet, Posterior > anterior with eccenteric anteriorly directed MR, reversal of flow right pulmonary and left lower pulmonary veins. Moderately severe MR. No significant myxomatous degeneration of the MV leaflets. Annulus mildly dilated. . TV: Normal Trace TR AV: Normal. Trace AI. PV: Normal. Trace PI.  Thoracic and ascending aorta: Normal without significant plaque or atheromatous changes.  Conscious sedation protocol was followed, I personally administered conscious sedation and monitored the patient. Patient received 2 milligrams of Versed and 25 fentanyl . Patient tolerated the procedure well and there was no complication from conscious sedation. Time administered was 18 minutes.

## 2018-09-05 NOTE — Interval H&P Note (Signed)
History and Physical Interval Note:  09/05/2018 10:54 AM  Derrick Craig.  has presented today for surgery, with the diagnosis of MR  The various methods of treatment have been discussed with the patient and family. After consideration of risks, benefits and other options for treatment, the patient has consented to  Procedure(s) with comments: TRANSESOPHAGEAL ECHOCARDIOGRAM (TEE) (N/A) - cath to follow as a surgical intervention .  The patient's history has been reviewed, patient examined, no change in status, stable for surgery.  I have reviewed the patient's chart and labs.  Questions were answered to the patient's satisfaction.     [name]

## 2018-09-05 NOTE — Progress Notes (Signed)
  Echocardiogram Echocardiogram Transesophageal has been performed.  Gerda Diss 09/05/2018, 11:43 AM

## 2018-09-05 NOTE — Discharge Instructions (Signed)
Radial Site Care ° °This sheet gives you information about how to care for yourself after your procedure. Your health care provider may also give you more specific instructions. If you have problems or questions, contact your health care provider. °What can I expect after the procedure? °After the procedure, it is common to have: °· Bruising and tenderness at the catheter insertion area. °Follow these instructions at home: °Medicines °· Take over-the-counter and prescription medicines only as told by your health care provider. °Insertion site care °· Follow instructions from your health care provider about how to take care of your insertion site. Make sure you: °? Wash your hands with soap and water before you change your bandage (dressing). If soap and water are not available, use hand sanitizer. °? Change your dressing as told by your health care provider. °? Leave stitches (sutures), skin glue, or adhesive strips in place. These skin closures may need to stay in place for 2 weeks or longer. If adhesive strip edges start to loosen and curl up, you may trim the loose edges. Do not remove adhesive strips completely unless your health care provider tells you to do that. °· Check your insertion site every day for signs of infection. Check for: °? Redness, swelling, or pain. °? Fluid or blood. °? Pus or a bad smell. °? Warmth. °· Do not take baths, swim, or use a hot tub until your health care provider approves. °· You may shower 24-48 hours after the procedure, or as directed by your health care provider. °? Remove the dressing and gently wash the site with plain soap and water. °? Pat the area dry with a clean towel. °? Do not rub the site. That could cause bleeding. °· Do not apply powder or lotion to the site. °Activity ° °· For 24 hours after the procedure, or as directed by your health care provider: °? Do not flex or bend the affected arm. °? Do not push or pull heavy objects with the affected arm. °? Do not  drive yourself home from the hospital or clinic. You may drive 24 hours after the procedure unless your health care provider tells you not to. °? Do not operate machinery or power tools. °· Do not lift anything that is heavier than 10 lb (4.5 kg), or the limit that you are told, until your health care provider says that it is safe. °· Ask your health care provider when it is okay to: °? Return to work or school. °? Resume usual physical activities or sports. °? Resume sexual activity. °General instructions °· If the catheter site starts to bleed, raise your arm and put firm pressure on the site. If the bleeding does not stop, get help right away. This is a medical emergency. °· If you went home on the same day as your procedure, a responsible adult should be with you for the first 24 hours after you arrive home. °· Keep all follow-up visits as told by your health care provider. This is important. °Contact a health care provider if: °· You have a fever. °· You have redness, swelling, or yellow drainage around your insertion site. °Get help right away if: °· You have unusual pain at the radial site. °· The catheter insertion area swells very fast. °· The insertion area is bleeding, and the bleeding does not stop when you hold steady pressure on the area. °· Your arm or hand becomes pale, cool, tingly, or numb. °These symptoms may represent a serious problem   that is an emergency. Do not wait to see if the symptoms will go away. Get medical help right away. Call your local emergency services (911 in the [place] Do not drive yourself to the hospital. °Summary °· After the procedure, it is common to have bruising and tenderness at the site. °· Follow instructions from your health care provider about how to take care of your radial site wound. Check the wound every day for signs of infection. °· Do not lift anything that is heavier than 10 lb (4.5 kg), or the limit that you are told, until your health care provider says  that it is safe. °This information is not intended to replace advice given to you by your health care provider. Make sure you discuss any questions you have with your health care provider. °Document Released: 07/31/2010 Document Revised: 08/03/2017 Document Reviewed: 08/03/2017 °Elsevier Interactive Patient Education © 2019 [employer] ° °

## 2018-09-05 NOTE — Interval H&P Note (Signed)
History and Physical Interval Note:  09/05/2018 12:09 PM  Derrick Craig.  has presented today for surgery, with the diagnosis of mr  The various methods of treatment have been discussed with the patient and family. After consideration of risks, benefits and other options for treatment, the patient has consented to  Procedure(s): RIGHT/LEFT HEART CATH AND CORONARY ANGIOGRAPHY (N/A) as a surgical intervention .  The patient's history has been reviewed, patient examined, no change in status, stable for surgery.  I have reviewed the patient's chart and labs.  Questions were answered to the patient's satisfaction.     2012 Appropriate Use Criteria for Diagnostic Catheterization Home / Select Test of Interest Indication for RHC Valvular Disease Valvular Disease (Right and Left Heart Catheterization or Right Heart Catheterization Alone With or  Valvular Disease  (Right and Left Heart Catheterization or Right Heart Catheterization  Alone With or Without Left Ventriculography and Coronary Angiography) Link Here: [email or web address] Indication:  1. Preoperative assessment before valvular surgery A (7) Indication: 70; Score 7    Manish J Patwardhan

## 2018-09-06 ENCOUNTER — Encounter (HOSPITAL_COMMUNITY): Payer: Self-pay | Admitting: Cardiology

## 2018-09-07 ENCOUNTER — Other Ambulatory Visit: Payer: Self-pay | Admitting: Cardiology

## 2018-09-07 DIAGNOSIS — I340 Nonrheumatic mitral (valve) insufficiency: Secondary | ICD-10-CM

## 2018-09-08 ENCOUNTER — Telehealth: Payer: Self-pay

## 2018-09-08 NOTE — Telephone Encounter (Signed)
Can pt return to work this coming Monday ?

## 2018-09-09 NOTE — Telephone Encounter (Signed)
yes

## 2018-09-13 NOTE — Telephone Encounter (Signed)
Tried callin pt no answer

## 2018-09-18 DIAGNOSIS — H401222 Low-tension glaucoma, left eye, moderate stage: Secondary | ICD-10-CM | POA: Diagnosis not present

## 2018-09-18 DIAGNOSIS — H25043 Posterior subcapsular polar age-related cataract, bilateral: Secondary | ICD-10-CM | POA: Diagnosis not present

## 2018-09-18 DIAGNOSIS — H401211 Low-tension glaucoma, right eye, mild stage: Secondary | ICD-10-CM | POA: Diagnosis not present

## 2018-09-18 DIAGNOSIS — H5213 Myopia, bilateral: Secondary | ICD-10-CM | POA: Diagnosis not present

## 2018-11-08 ENCOUNTER — Other Ambulatory Visit: Payer: Self-pay

## 2018-11-08 ENCOUNTER — Encounter: Payer: Self-pay | Admitting: Cardiology

## 2018-11-08 ENCOUNTER — Ambulatory Visit: Payer: Federal, State, Local not specified - PPO | Admitting: Cardiology

## 2018-11-08 VITALS — Ht 72.0 in | Wt 200.0 lb

## 2018-11-08 DIAGNOSIS — I340 Nonrheumatic mitral (valve) insufficiency: Secondary | ICD-10-CM | POA: Diagnosis not present

## 2018-11-08 DIAGNOSIS — R0600 Dyspnea, unspecified: Secondary | ICD-10-CM

## 2018-11-08 DIAGNOSIS — R0609 Other forms of dyspnea: Secondary | ICD-10-CM

## 2018-11-08 DIAGNOSIS — I341 Nonrheumatic mitral (valve) prolapse: Secondary | ICD-10-CM

## 2018-11-08 DIAGNOSIS — Z298 Encounter for other specified prophylactic measures: Secondary | ICD-10-CM

## 2018-11-08 NOTE — Progress Notes (Signed)
Primary Physician/Referring:  Pearson Grippe, MD  Patient ID: Derrick Craig., male    DOB: [birth year], 58 y.o.   MRN: [id number]  No chief complaint on file.  This visit type was conducted due to national recommendations for restrictions regarding the COVID-19 Pandemic (e.g. social distancing).  This format is felt to be most appropriate for this patient at this time.  All issues noted in this document were discussed and addressed.  No physical exam was performed (except for noted visual exam findings with Telehealth visits).  The patient has consented to conduct a Telehealth visit and understands insurance will be billed.   I discussed the limitations of evaluation and management by telemedicine and the availability of in person appointments. The patient expressed understanding and agreed to proceed.  Virtual Visit via Video Note is as below  I connected with Derrick Craig, on 11/08/18 at 1300 by telephone and verified that I am speaking with the correct person using two identifiers. Patient did not have equipment for video visit.    I have discussed with her regarding the safety during COVID Pandemic and steps and precautions including social distancing with the patient.    HPI: Derrick Craig.  is a 58 y.o. male  with history of mitral valve prolapse and moderate to severe MR that is essentially asymptomatic.  Underwent echocardiogram on 08/15/2018 that was unchanged from before, except for newly noted LV dilation. Underwent coronary angiogram on 09/05/2018 that revealed normal coronary arteries and normal right heart pressures. TEE also performed that day confirmed moderate to severe MR with mild LV dilation. He was referred to CV surgery; however, has not yet had his appointment. He is here for follow up.   He states that he is been doing well and has not had any significant symptoms. He continues to have mild exertional dyspnea with extremes of exertion while lifting heavy  objects at work place. Does notice increased fatigue recently, but feels that this is related to having to work significantly more hours due to COVID situation. He works at the [employer]. No PND or orthopnea. He denies any chest pain or palpitations. No fever, chills. Reports gaining weight since his Aunt passed away.   Past Medical History:  Diagnosis Date  . Glaucoma   . Heart valve problem   . MVP (mitral valve prolapse)     Past Surgical History:  Procedure Laterality Date  . RIGHT/LEFT HEART CATH AND CORONARY ANGIOGRAPHY N/A 09/05/2018   Procedure: RIGHT/LEFT HEART CATH AND CORONARY ANGIOGRAPHY;  Surgeon: Elder Negus, MD;  Location: [facility];  Service: Cardiovascular;  Laterality: N/A;  . TEE WITHOUT CARDIOVERSION N/A 09/05/2018   Procedure: TRANSESOPHAGEAL ECHOCARDIOGRAM (TEE);  Surgeon: [name], MD;  Location: [facility];  Service: Cardiovascular;  Laterality: N/A;  cath to follow  . TONSILLECTOMY      Social History   Socioeconomic History  . Marital status: Single    Spouse name: Not on file  . Number of children: 0  . Years of education: Not on file  . Highest education level: Not on file  Occupational History  . Not on file  Social Needs  . Financial resource strain: Not on file  . Food insecurity:    Worry: Not on file    Inability: Not on file  . Transportation needs:    Medical: Not on file    Non-medical: Not on file  Tobacco Use  . Smoking status: Never Smoker  .  Smokeless tobacco: Never Used  Substance and Sexual Activity  . Alcohol use: No  . Drug use: Never  . Sexual activity: Not on file  Lifestyle  . Physical activity:    Days per week: Not on file    Minutes per session: Not on file  . Stress: Not on file  Relationships  . Social connections:    Talks on phone: Not on file    Gets together: Not on file    Attends religious service: Not on file    Active member of club or organization: Not on file    Attends meetings  of clubs or organizations: Not on file    Relationship status: Not on file  . Intimate partner violence:    Fear of current or ex partner: Not on file    Emotionally abused: Not on file    Physically abused: Not on file    Forced sexual activity: Not on file  Other Topics Concern  . Not on file  Social History Narrative  . Not on file    Current Outpatient Medications on File Prior to Visit  Medication Sig Dispense Refill  . latanoprost (XALATAN) 0.005 % ophthalmic solution Place 1 drop into both eyes at bedtime.    Marland Kitchen. lisinopril (PRINIVIL,ZESTRIL) 20 MG tablet Take 20 mg by mouth daily after breakfast.     . metroNIDAZOLE (METROGEL) 0.75 % gel Apply 1 application topically daily as needed (skin irritation/rosacea.).     Marland Kitchen. timolol (TIMOPTIC) 0.5 % ophthalmic solution Place 1 drop into the left eye daily.      No current facility-administered medications on file prior to visit.     Review of Systems  Constitution: Positive for malaise/fatigue. Negative for decreased appetite, weight gain and weight loss.  Eyes: Negative for visual disturbance.  Cardiovascular: Positive for dyspnea on exertion. Negative for chest pain, claudication, leg swelling, orthopnea, palpitations and syncope.  Respiratory: Negative for hemoptysis and wheezing.   Endocrine: Negative for cold intolerance and heat intolerance.  Hematologic/Lymphatic: Does not bruise/bleed easily.  Skin: Negative for nail changes.  Musculoskeletal: Negative for muscle weakness and myalgias.  Gastrointestinal: Negative for abdominal pain, change in bowel habit, nausea and vomiting.  Neurological: Negative for difficulty with concentration, dizziness, focal weakness and headaches.  Psychiatric/Behavioral: Negative for altered mental status and suicidal ideas.  All other systems reviewed and are negative.     Objective  There were no vitals taken for this visit. There is no height or weight on file to calculate BMI.    Physical  exam not performed or limited due to virtual visit. Please see exam details from prior visit is as below.   Physical Exam  Constitutional: He is oriented to person, place, and time. Vital signs are normal. He appears well-developed and well-nourished.  HENT:  Head: Normocephalic and atraumatic.  Neck: Normal range of motion.  Cardiovascular: Normal rate, regular rhythm and intact distal pulses. Exam reveals a midsystolic click.  Murmur heard. High-pitched blowing holosystolic murmur is present with a grade of 2/6 at the apex radiating to the axilla. Varicose veins; bilateral trace edema.  Pulmonary/Chest: Effort normal and breath sounds normal. No accessory muscle usage. No respiratory distress.  Abdominal: Soft. Bowel sounds are normal.  Musculoskeletal: Normal range of motion.  Neurological: He is alert and oriented to person, place, and time.  Skin: Skin is warm and dry.  Vitals reviewed.  Radiology: No results found.  Laboratory examination:    CMP Latest Ref Rng & Units  09/05/2018 09/05/2018 09/05/2018  Glucose 65 - 99 mg/dL - - -  BUN 6 - 24 mg/dL - - -  Creatinine 4.09 - 1.27 mg/dL - - -  Sodium 811 - 914 mmol/L 141 140 139  Potassium 3.5 - 5.1 mmol/L 3.9 4.1 4.1  Chloride 96 - 106 mmol/L - - -  CO2 20 - 29 mmol/L - - -  Calcium 8.7 - 10.2 mg/dL - - -   CBC Latest Ref Rng & Units 09/05/2018 09/05/2018 09/05/2018  WBC 3.4 - 10.8 x10E3/uL - - -  Hemoglobin 13.0 - 17.0 g/dL 12.6(L) 12.9(L) 12.6(L)  Hematocrit 39.0 - 52.0 % 37.0(L) 38.0(L) 37.0(L)  Platelets 150 - 450 x10E3/uL - - -   Lipid Panel  No results found for: CHOL, TRIG, HDL, CHOLHDL, VLDL, LDLCALC, LDLDIRECT HEMOGLOBIN A1C No results found for: HGBA1C, MPG TSH No results for input(s): TSH in the last 8760 hours.  Cardiac Studies:   Echocardiogram 08/15/2018: Left ventricle cavity is mildly dilated at 6.1 cm and LVIDS is normal at 41.cm. Mild concentric hypertrophy of the left ventricle. Normal global wall  motion. Normal diastolic filling pattern. Calculated EF 61%. Left atrial cavity is modearely dilated by volume at 52mL and measures 4cm in long axis. The interatrial Septum is thin and mobile but appears to be intact by 2D and CF Doppler interrogation. Trace aortic regurgitation. Moderately severe posteriorly directed (Grade III) mitral regurgitation. Moderate mitral valve prolapse with symmetric leaflets. Mild Myxomatous degeneration of the MV leaflets. Trace tricuspid regurgitation. Inadequate tricuspid regurgitation jet to estimate pulmonary artery pressure. To the study done on 08/30/2017: Left ventricle is now mildly dilated compared to normal.  Treadmill stress test [02/16/2016]: Indications: Screening for CAD, Mitral regurgitation. The resting electrocardiogram demonstrated normal sinus rhythm, normal resting conduction, no resting arrhythmias and normal rest repolarization. The stress electrocardiogram was normal. There were no significant arrhythmias. Rare PVC. Patient exercised on Bruce protocol for 13.00 minutes and achieved 114 % of Max Predicted HR (Target HR was >85% MPHR) and 14.16 METS. Stress symptoms included fatigue. Normal BP response. Exercise capacity was excellent . Impression: Normal stress EKG. No significant arrhythmias. Normal BP response.  Assessment   Severe mitral valve regurgitation  Mitral valve prolapse  Dyspnea on exertion  Indication present for endocarditis prophylaxis  EKG 08/23/2018: Sinus bradycardia at 57 bpm, normal axis, left atrial enlargement, inferior infarct old. No evidence of ischemia. No changes compared to previous EKG.  Recommendations:   Patient presents for follow-up for severe MR.  I have discussed coronary angiogram findings as well as TEE findings.  Moderate to severe MR with mild LV dilation confirmed by TEE.  Symptomatically he has remained essentially stable with mild dyspnea on exertion.  Does noticed increased fatigue recently,  possibly related to increased workload in view of COVID.  Referral was placed to CV surgery after his last office visit in February; however, he has yet to hear from them as far as scheduling an appointment.  I will follow-up on referral and reorder if needed.  Hopefully he will be a candidate for minimally invasive procedure.  He is aware of continued need for antibiotic prophylaxis.  No further complaints today.  We will notify him of referral status.  I will tentatively schedule him to see him back in 3 months depending upon his procedure.  Encouraged him to contact me sooner for any problems or concerns.  Toniann Fail, MSN, APRN, FNP-C [facility] [place] Office: [phone] Fax: [phone]

## 2018-11-09 ENCOUNTER — Encounter: Payer: Self-pay | Admitting: Cardiology

## 2018-11-10 ENCOUNTER — Other Ambulatory Visit: Payer: Self-pay

## 2018-11-13 ENCOUNTER — Encounter: Payer: Self-pay | Admitting: Thoracic Surgery (Cardiothoracic Vascular Surgery)

## 2018-11-13 ENCOUNTER — Other Ambulatory Visit: Payer: Self-pay

## 2018-11-13 ENCOUNTER — Institutional Professional Consult (permissible substitution): Payer: Federal, State, Local not specified - PPO | Admitting: Thoracic Surgery (Cardiothoracic Vascular Surgery)

## 2018-11-13 VITALS — BP 110/68 | HR 55 | Temp 97.9°F | Resp 16 | Wt 193.2 lb

## 2018-11-13 DIAGNOSIS — R0609 Other forms of dyspnea: Secondary | ICD-10-CM

## 2018-11-13 DIAGNOSIS — I340 Nonrheumatic mitral (valve) insufficiency: Secondary | ICD-10-CM

## 2018-11-13 DIAGNOSIS — I341 Nonrheumatic mitral (valve) prolapse: Secondary | ICD-10-CM | POA: Diagnosis not present

## 2018-11-13 DIAGNOSIS — R0600 Dyspnea, unspecified: Secondary | ICD-10-CM

## 2018-11-13 NOTE — Progress Notes (Signed)
[address]       [place] [address]             [phone]     CARDIOTHORACIC SURGERY CONSULTATION REPORT  Primary Cardiologist is [name], MD PCP is Pearson Grippe, MD  Chief Complaint  Patient presents with  . Mitral Regurgitation    CATH/TEE 09/05/18    HPI:  Patient is a 58 year old male with history of mitral valve prolapse who has been referred for surgical consultation to discuss treatment options for management of severe symptomatic primary mitral regurgitation.  Patient states that he was first noted to have a heart murmur on physical exam several years ago.  He was referred to Dr. Jacinto Halim who has been following him closely ever since.  Transthoracic echocardiograms have documented the presence of mitral valve prolapse with mitral regurgitation that is gradually progressed in severity.  Most recent follow-up echocardiogram performed August 15, 2018 revealed significant left ventricular chamber enlargement.  The patient subsequently underwent transesophageal echocardiogram and diagnostic cardiac catheterization on September 05, 2018.  Catheterization revealed normal coronary arteries with normal right heart pressures.  TEE confirmed the presence of mitral valve prolapse with moderate to severe mitral regurgitation and mild left ventricular chamber enlargement.  The patient was referred for surgical consultation but his consultation was initially delayed because of the COVID-19 pandemic.  In recent weeks the patient has become increasingly anxious and he requested an in person office consultation visit.  The patient is single and lives alone in [place].  He works full-time for the [employer].  He works at the large [employer] which requires fairly strenuous exertion.  He has noticed over the past few years gradual progression of symptoms of exertional shortness of breath.  Symptoms occur only with more strenuous exertion.  He states that  when he mows his lawn during that hot summer he now has to stop take breaks.  Symptoms do not occur with low-level activity or at rest.  He specifically denies any history of resting shortness of breath, PND, orthopnea, or lower extremity edema.  He has not had any chest pain or chest tightness either with activity or at rest.  Denies any palpitations, dizzy spells, or syncope.  He reports no other significant physical problems and he remains relatively active physically and without other significant physical limitations.  Past Medical History:  Diagnosis Date  . Glaucoma   . Heart valve problem   . MVP (mitral valve prolapse)   . Severe mitral valve regurgitation     Past Surgical History:  Procedure Laterality Date  . RIGHT/LEFT HEART CATH AND CORONARY ANGIOGRAPHY N/A 09/05/2018   Procedure: RIGHT/LEFT HEART CATH AND CORONARY ANGIOGRAPHY;  Surgeon: Elder Negus, MD;  Location: [facility] INVASIVE CV [facility];  Service: Cardiovascular;  Laterality: N/A;  . TEE WITHOUT CARDIOVERSION N/A 09/05/2018   Procedure: TRANSESOPHAGEAL ECHOCARDIOGRAM (TEE);  Surgeon: [name], MD;  Location: [facility] ENDOSCOPY;  Service: Cardiovascular;  Laterality: N/A;  cath to follow  . TONSILLECTOMY      Family History  Problem Relation Age of Onset  . Heart failure Mother     Social History   Socioeconomic History  . Marital status: Single    Spouse name: Not on file  . Number of children: 0  . Years of education: Not on file  . Highest education level: Not on file  Occupational History  . Not on file  Social Needs  . Financial  resource strain: Not on file  . Food insecurity:    Worry: Not on file    Inability: Not on file  . Transportation needs:    Medical: Not on file    Non-medical: Not on file  Tobacco Use  . Smoking status: Never Smoker  . Smokeless tobacco: Never Used  Substance and Sexual Activity  . Alcohol use: No  . Drug use: Never  . Sexual activity: Not on file  Lifestyle  . Physical  activity:    Days per week: Not on file    Minutes per session: Not on file  . Stress: Not on file  Relationships  . Social connections:    Talks on phone: Not on file    Gets together: Not on file    Attends religious service: Not on file    Active member of club or organization: Not on file    Attends meetings of clubs or organizations: Not on file    Relationship status: Not on file  . Intimate partner violence:    Fear of current or ex partner: Not on file    Emotionally abused: Not on file    Physically abused: Not on file    Forced sexual activity: Not on file  Other Topics Concern  . Not on file  Social History Narrative  . Not on file    Current Outpatient Medications  Medication Sig Dispense Refill  . latanoprost (XALATAN) 0.005 % ophthalmic solution Place 1 drop into both eyes at bedtime.    Marland Kitchen lisinopril (PRINIVIL,ZESTRIL) 20 MG tablet Take 20 mg by mouth daily after breakfast.     . timolol (TIMOPTIC) 0.5 % ophthalmic solution Place 1 drop into the left eye daily.      No current facility-administered medications for this visit.     No Known Allergies    Review of Systems:   General:  normal appetite, normal energy, no weight gain, + weight loss, no fever  Cardiac:  no chest pain with exertion, no chest pain at rest, + SOB with exertion, no resting SOB, no PND, no orthopnea, no palpitations, no arrhythmia, no atrial fibrillation, no LE edema, no dizzy spells, no syncope  Respiratory:  + exertional shortness of breath, no home oxygen, no productive cough, no dry cough, no bronchitis, no wheezing, no hemoptysis, no asthma, no pain with inspiration or cough, no sleep apnea, no CPAP at night  GI:   no difficulty swallowing, no reflux, no frequent heartburn, no hiatal hernia, no abdominal pain, no constipation, no diarrhea, no hematochezia, no hematemesis, no melena  GU:   no dysuria,  no frequency, no urinary tract infection, no hematuria, no enlarged prostate, no  kidney stones, no kidney disease  Vascular:  no pain suggestive of claudication, no pain in feet, no leg cramps, no varicose veins, no DVT, no non-healing foot ulcer  Neuro:   no stroke, no TIA's, no seizures, no headaches, no temporary blindness one eye,  no slurred speech, no peripheral neuropathy, no chronic pain, no instability of gait, no memory/cognitive dysfunction  Musculoskeletal: no arthritis, no joint swelling, no myalgias, no difficulty walking, normal mobility   Skin:   no rash, no itching, no skin infections, no pressure sores or ulcerations  Psych:   no anxiety, no depression, no nervousness, no unusual recent stress  Eyes:   no blurry vision, no floaters, no recent vision changes, + wears glasses or contacts  ENT:   no hearing loss, no loose or painful teeth,  no dentures, last saw dentist many years ago  Hematologic:  no easy bruising, no abnormal bleeding, no clotting disorder, no frequent epistaxis  Endocrine:  no diabetes, does not check CBG's at home     Physical Exam:   BP 110/68 (BP Location: Right Arm, Patient Position: Sitting, Cuff Size: Large) Comment: MANUALLY  Pulse (!) 55   Temp 97.9 F (36.6 C) (Temporal)   Resp 16   Wt 193 lb 3.2 oz (87.6 kg)   SpO2 98% Comment: ON RA  BMI 26.20 kg/m   General:  Thin,  well-appearing  HEENT:  Unremarkable   Neck:   no JVD, no bruits, no adenopathy   Chest:   clear to auscultation, symmetrical breath sounds, no wheezes, no rhonchi   CV:   RRR, grade II/VI systolic murmur   Abdomen:  soft, non-tender, no masses   Extremities:  warm, well-perfused, pulses palpable, no LE edema  Rectal/GU  Deferred  Neuro:   Grossly non-focal and symmetrical throughout  Skin:   Clean and dry, no rashes, no breakdown   Diagnostic Tests:  TRANSTHORACIC ECHOCARDIOGRAM  Echocardiogram 08/15/2018: Left ventricle cavity is mildly dilated at 6.1 cm and LVIDS is normal at 41.cm. Mild concentric hypertrophy of the left ventricle. Normal  global wall motion. Normal diastolic filling pattern. Calculated EF 61%. Left atrial cavity is modearely dilated by volume at 67mL and measures 4cm in long axis. The interatrial Septum is thin and mobile but appears to be intact by 2D and CF Doppler interrogation. Trace aortic regurgitation. Moderately severe posteriorly directed (Grade III) mitral regurgitation. Moderate mitral valve prolapse with symmetric leaflets. Mild Myxomatous degeneration of the MV leaflets. Trace tricuspid regurgitation. Inadequate tricuspid regurgitation jet to estimate pulmonary artery pressure. To the study done on 08/30/2017: Left ventricle is now mildly dilated compared to normal.     TRANSESOPHOGEAL ECHO REPORT    Patient Name:   Derrick Craig. Date of Exam: 09/05/2018 Medical Rec #:  [id number]              Height:       72.0 in Accession #:    [id number]             Weight:       205.1 lb Date of Birth:  21-Jan-1961             BSA:          2.15 m Patient Age:    57 years               BP:           148/76 mmHg Patient Gender: M                      HR:           68 bpm. Exam Location:  Inpatient    Procedure: Transesophageal Echo  Indications:     Mitral regurgiation   History:         Patient has no prior history of Echocardiogram examinations.                  Mitral Valve Prolapse and Mitral Valve Disease; Signs/Symptoms:                  Dyspnea on exertion.   Sonographer:     Ross Ludwig RDCS (AE) Referring Phys:  [id number] [name] Diagnosing Phys: [name] MD     PROCEDURE: Normal Transesophogeal exam.  Patients was under conscious sedation during this procedure. Anesthetic was administered intravenously by performing Physician: of Fentanyl, 2.0mg  of Versed. The patient developed no complications during the  procedure. 18 minutes of moderate sedation observation and management.  IMPRESSIONS    1. The left ventricle has normal systolic function, with an ejection  fraction of 55-60%. The cavity size was mildly dilated. Left ventricular diastology could not be evaluated No evidence of left ventricular regional wall motion abnormalities.  2. Left atrial size was moderately dilated.  3. The mitral valve is myxomatous. Mild thickening of the mitral valve leaflet. Mild prolapse of the anterior (A2) and posterior (P2) MV leaflets, predominantly P2 segment. Mitral valve regurgitation is moderate to severe by color flow Doppler. The MR  jet is anteriorly-directed. There was flow reversal in the left lower pulmonary vein and right pulmonary vein.  4. The tricuspid valve was normal in structure.  5. The aortic valve is tricuspid Aortic valve regurgitation is trivial by color flow Doppler.  6. The pulmonic valve was normal in structure.  7. The aortic root, ascending aorta, aortic arch and descending aorta are normal in size and structure.  8. Very Small patent foramen ovale with predominantly left to right shunting across the atrial septum noted by color Doppler. Double contrast study not performed.  9. No evidence of left ventricular regional wall motion abnormalities.  FINDINGS  Left Ventricle: The left ventricle has normal systolic function, with an ejection fraction of 55-60%. The cavity size was mildly dilated. There is no increase in left ventricular wall thickness. Left ventricular diastology could not be evaluated No  evidence of left ventricular regional wall motion abnormalities. Right Ventricle: The right ventricle has normal systolic function. The cavity was Borderline enlarged. Left Atrium: Left atrial size was moderately dilated. Right Atrium: Right atrial size was normal in size. Right atrial pressure is estimated at 10 mmHg. Interatrial Septum: Double contrast study not performed. A small patent foramen ovale is detected with predominantly left to right shunting across the atrial septum. Pericardium: There is no evidence of pericardial effusion. There  is no pleural effusion. Mitral Valve: The mitral valve is myxomatous. Mild thickening of the mitral valve leaflet. Mitral valve regurgitation is moderate to severe by color flow Doppler. The MR jet is anteriorly-directed. Pulmonary venous flow shows systolic flow reversal. Tricuspid Valve: The tricuspid valve was normal in structure. Tricuspid valve regurgitation is trivial by color flow Doppler. Aortic Valve: The aortic valve is tricuspid Aortic valve regurgitation is trivial by color flow Doppler. Pulmonic Valve: The pulmonic valve was normal in structure. Pulmonic valve regurgitation is not visualized by color flow Doppler. Aorta: The aortic root, ascending aorta and aortic arch are normal in size and structure.   RIGHT ATRIUM RA Pressure: 10 mmHg    [name] MD Electronically signed by [name] MD Signature Date/Time: 09/05/2018/3:09:10 PM     RIGHT/LEFT HEART CATH AND CORONARY ANGIOGRAPHY  Conclusion   Normal coronary arteries with superdominant right coronary artery No coronary artery disease Normal filling pressures.  Recommendation: Follow up with CVTS re: management of severe mitral regrgitation  Elder Negus, MD [facility] Pager: [phone] Office: [phone] If no answer Cell [phone]    Recommendations   Antiplatelet/Anticoag No indication for antiplatelet therapy at this time .  Surgeon Notes     09/05/2018 11:19 AM CV Procedure signed by [name], MD  Indications   Severe mitral regurgitation [I34.0 (ICD-10-CM)]  Procedural Details   Technical Details Procedures:  1. Right heart catheterization 2. Left heart catheterization 3. Selective left and right coronary angiography 4. Conscious sedation monitoring 17 min  Indication: Severe mitral regurgitation  History: 58 y/o Caucasian male with severe mitral regurgitation here for pre-op evaluation.  Diagnostic Angiography: 5 Fr TIG 4.0  Pressures tracings  obtained in right atrium, right ventricle, pulmonary artery, and pulmonary capillary wedge position.   Anticoagulation:  4500 units heparin  Hemostasis: TR band  Total contrast used: 15 cc   Total fluoro time: 2.9 min Air Kerma: 90 mGy  All wires and catheters removed out of the body at the end of the procedure Final angiogram showed no dissection/perforation         Estimated blood loss <50 mL.   During this procedure medications were administered to achieve and maintain moderate conscious sedation while the patient's heart rate, blood pressure, and oxygen saturation were continuously monitored and I was present face-to-face 100% of this time.  Medications  (Filter: Administrations occurring from 09/05/18 1137 to 09/05/18 1248)  Medication Rate/Dose/Volume Action  Date Time   Heparin (Porcine) in NaCl 1000-0.9 UT/500ML-% SOLN (mL) 500 mL Given 09/05/18 1152   Total dose as of 11/13/18 1037 500 mL Given 1152   1,500 mL 500 mL Given 1159   0.9 % sodium chloride infusion (mL/hr) 10 mL/hr New Bag/Given 09/05/18 1152   Dosing weight:  93 kg        Total dose as of 11/13/18 1037        Cannot be calculated        fentaNYL (SUBLIMAZE) injection (mcg) 25 mcg Given 09/05/18 1213   Total dose as of 11/13/18 1037        25 mcg        midazolam (VERSED) injection (mg) 1 mg Given 09/05/18 1213   Total dose as of 11/13/18 1037        1 mg        lidocaine (PF) (XYLOCAINE) 1 % injection (mL) 2 mL Given 09/05/18 1213   Total dose as of 11/13/18 1037        2 mL        heparin injection (Units) 4,500 Units Given 09/05/18 1224   Total dose as of 11/13/18 1037        4,500 Units        Radial Cocktail/Verapamil only (mL) 10 mL Given 09/05/18 1217   Total dose as of 11/13/18 1037        10 mL        iohexol (OMNIPAQUE) 350 MG/ML injection (mL) 15 mL Given 09/05/18 1236   Total dose as of 11/13/18 1037        15 mL        Sedation Time   Sedation Time Physician-1: 17 minutes 23  seconds  Complications   Complications documented before study signed (09/05/2018 12:48 PM EST)    RIGHT/LEFT HEART CATH AND CORONARY ANGIOGRAPHY   None Documented by Elder Negus, MD 09/05/2018 12:36 PM EST  Time Range: Intraprocedure      Coronary Findings   Diagnostic  Dominance: Right  Left Main  Vessel is normal in caliber. Vessel is angiographically normal.  Left Anterior Descending  Vessel is normal in caliber. Vessel is angiographically normal.  Left Circumflex  Vessel is normal in caliber. Vessel is angiographically normal.  Right Coronary Artery  Vessel is very large. Vessel is angiographically normal.  Intervention   No interventions have been documented.  Right Heart  Right Heart Pressures RA: 5 mmHg RV: 34/3 mmHg PA: 34/11 mmHg, mean PA 20 mmHg PCWP: 9 mmHg  CO: 7.3 L/min CI: 3.4 L/min/m2  Left Heart   Left Ventricle LV end diastolic pressure is normal.  Coronary Diagrams   Diagnostic  Dominance: Right    Intervention   Implants    No implant documentation for this case.  Syngo Images   Show images for CARDIAC CATHETERIZATION  Images on Long Term Storage   Show images for Alvan DameShepherd, Kishawn E Jr.   Link to Procedure Log   Procedure Log    Hemo Data    Most Recent Value  Fick Cardiac Output 7.34 L/min  Fick Cardiac Output Index 3.41 (L/min)/BSA  RA A Wave 9 mmHg  RA V Wave 6 mmHg  RA Mean 5 mmHg  RV Systolic Pressure 34 mmHg  RV Diastolic Pressure 3 mmHg  RV EDP 6 mmHg  PA Systolic Pressure 34 mmHg  PA Diastolic Pressure 11 mmHg  PA Mean 20 mmHg  PW A Wave 11 mmHg  PW V Wave 11 mmHg  PW Mean 9 mmHg  AO Systolic Pressure 114 mmHg  AO Diastolic Pressure 66 mmHg  AO Mean 88 mmHg  LV Systolic Pressure 107 mmHg  LV Diastolic Pressure 6 mmHg  LV EDP 9 mmHg  AOp Systolic Pressure 112 mmHg  AOp Diastolic Pressure 66 mmHg  AOp Mean Pressure 87 mmHg  LVp Systolic Pressure 112 mmHg  LVp Diastolic Pressure 5 mmHg  LVp EDP  Pressure 10 mmHg  QP/QS 1  TPVR Index 5.86 HRUI  TSVR Index 25.81 HRUI  PVR SVR Ratio 0.13  TPVR/TSVR Ratio 0.23     Impression:  Patient has mitral valve prolapse with stage D severe symptomatic primary mitral regurgitation.  I have personally reviewed the patient's recent transesophageal echocardiogram and diagnostic cardiac catheterization.  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  There are multiple elongated primary chordae tendinae.  I do not appreciate any ruptured chordae tendinae.  There is at least moderate and probably severe central mitral regurgitation.  There is mild left ventricular chamber enlargement and moderate left atrial enlargement.  No other significant abnormalities are noted.  Diagnostic cardiac catheterization is notable for the absence of significant coronary artery disease.  Right heart pressures were normal.  I agree the patient would best be treated with elective mitral valve repair.  Based upon review of the patient's transesophageal echocardiogram I feel there is a greater than 95% likelihood of successful and durable mitral valve repair with very low operative risk.  The patient will likely be good candidate for minimally invasive approach for surgery.   Plan:  The patient was counseled at length regarding the indications, risks and potential benefits of mitral valve repair.  The rationale for elective surgery has been explained, including a comparison between surgery and continued medical therapy with close follow-up.  The likelihood of successful and durable valve repair has been discussed with particular reference to the findings of their recent echocardiogram.  Based upon these findings and previous experience, I have quoted them a greater than 95 percent likelihood of successful valve repair.  Alternative surgical  approaches have been discussed including a comparison between conventional sternotomy and minimally-invasive techniques.  Expectations for the patient's postoperative convalescence have been discussed.  We discussed timing of surgery particularly with reference to ongoing restrictions and recommendations for social distancing related  to the COVID-19 pandemic.  In the short-term I feel that the patient remains at relatively low risk for significant progression of disease.  Under the circumstances we could potentially consider proceeding with surgery at some point in early June presuming that restrictions on operating room capacity have been lifted.  At some point the patient will need to see a dentist and I have offered to refer him to our local dentist for examination and cleaning.  Prior to surgery he will undergo CT angiography to evaluate the feasibility of peripheral cannulation for surgery.  The patient will return to our office for final consultation once he has made a decision about the timing of surgery.  All questions answered.    I spent in excess of 90 minutes during the conduct of this office consultation and >50% of this time involved direct face-to-face encounter with the patient for counseling and/or coordination of their care.   Salvatore Decent. Cornelius Moras, MD 11/13/2018 9:48 AM

## 2018-11-13 NOTE — Patient Instructions (Signed)
Continue all previous medications without any changes at this time  

## 2018-11-15 ENCOUNTER — Other Ambulatory Visit: Payer: Self-pay

## 2018-11-15 ENCOUNTER — Ambulatory Visit (HOSPITAL_COMMUNITY): Payer: Self-pay | Admitting: Dentistry

## 2018-11-15 ENCOUNTER — Encounter (HOSPITAL_COMMUNITY): Payer: Self-pay | Admitting: Dentistry

## 2018-11-15 VITALS — BP 125/73 | HR 51 | Temp 98.5°F

## 2018-11-15 DIAGNOSIS — I340 Nonrheumatic mitral (valve) insufficiency: Secondary | ICD-10-CM

## 2018-11-15 DIAGNOSIS — Z01818 Encounter for other preprocedural examination: Secondary | ICD-10-CM | POA: Diagnosis not present

## 2018-11-15 NOTE — Patient Instructions (Signed)
[place]    Department of Dental Medicine     DR. KULINSKI      HEART VALVES AND MOUTH CARE:  FACTS:   If you have any infection in your mouth, it can infect your heart valve.  If you heart valve is infected, you will be seriously ill.  Infections in the mouth can be SILENT and do not always cause pain.  Examples of infections in the mouth are gum disease, dental cavities, and abscesses.  Some possible signs of infection are: Bad breath, bleeding gums, or teeth that are sensitive to sweets, hot, and/or cold. There are many other signs as well.  WHAT YOU HAVE TO DO:   Brush your teeth after meals and at bedtime. Spend at least 2 minutes brushing well, especially behind your back teeth and all around your teeth that stand alone. Brush at the gumline also.  Do not go to bed without brushing your teeth and flossing.  If you gums bleed when you brush or floss, do NOT stop brushing or flossing. It usually means that your gums need more attention and better cleaning.   If your Dentist or Dr. Kulinski gave you a prescription mouthwash to use, make sure to use it as directed. If you run out of the medication, get a refill at the pharmacy.   If you were given any other medications or directions by your Dentist, please follow them. If you did not understand the directions or forget what you were told, please call. We will be happy to refresh her memory.  If you need antibiotics before dental procedures, make sure you take them one hour prior to every dental visit as directed.   Get a dental checkup every 4-6 months in order to keep your mouth healthy, or to find and treat any new infection. You will most likely need your teeth cleaned or gums treated at the same time.  If you are not able to come in for your scheduled appointment, call your Dentist as soon as possible to reschedule.  If you have a problem in between dental visits, call your Dentist.  

## 2018-11-15 NOTE — Progress Notes (Signed)
DENTAL CONSULTATION  Date of Consultation:  11/15/2018 Patient Name:   Derrick Craig. Date of Birth:   [birth year] Medical Record Number: [id number]  VITALS: BP 125/73 (BP Location: Right Arm)   Pulse (!) 51   Temp 98.5 F (36.9 C)   CHIEF COMPLAINT: Patient referred by Dr. Cornelius Moras for a dental consultation.  HPI: Derrick Craig. is a 58 year old male recently diagnosed with severe mitral regurgitation. Patient with anticipated mitral valve replacement or repair in the near future. Patient is now seen as part of a medically necessary pre-heart valve surgery dental protocol examination to rule out dental infection that may affect the patient's systemic health and anticipated heart valve surgery.  The patient currently denies acute toothaches, swellings, or abscesses. Patient indicates it has been about 8 years since he was seen by the dentist. Patient saw Dr. Blake Divine at that time for an exam and cleaning. The patient also has a history of several "gum graft" procedures.  Patient had one 15 years ago and then another one 8-9 years ago by Dr. Burgess Estelle.  Patient denies having any missing teeth. Patient denies having any partial dentures. Patient denies having any dental phobia.  PROBLEM LIST: Patient Active Problem List   Diagnosis Date Noted  . Severe mitral valve regurgitation     Priority: High  . Dyspnea on exertion 08/22/2018  . Mitral valve prolapse 08/22/2018  . Indication present for endocarditis prophylaxis 08/22/2018    PMH: Past Medical History:  Diagnosis Date  . Glaucoma   . Heart valve problem   . MVP (mitral valve prolapse)   . Severe mitral valve regurgitation     PSH: Past Surgical History:  Procedure Laterality Date  . RIGHT/LEFT HEART CATH AND CORONARY ANGIOGRAPHY N/A 09/05/2018   Procedure: RIGHT/LEFT HEART CATH AND CORONARY ANGIOGRAPHY;  Surgeon: Elder Negus, MD;  Location: [facility];  Service: Cardiovascular;  Laterality: N/A;  .  TEE WITHOUT CARDIOVERSION N/A 09/05/2018   Procedure: TRANSESOPHAGEAL ECHOCARDIOGRAM (TEE);  Surgeon: [name], MD;  Location: [facility] ENDOSCOPY;  Service: Cardiovascular;  Laterality: N/A;  cath to follow  . TONSILLECTOMY      ALLERGIES: No Known Allergies  MEDICATIONS: Current Outpatient Medications  Medication Sig Dispense Refill  . latanoprost (XALATAN) 0.005 % ophthalmic solution Place 1 drop into both eyes at bedtime.    Marland Kitchen lisinopril (PRINIVIL,ZESTRIL) 20 MG tablet Take 20 mg by mouth daily after breakfast.     . timolol (TIMOPTIC) 0.5 % ophthalmic solution Place 1 drop into the left eye daily.      No current facility-administered medications for this visit.     LABS: Lab Results  Component Value Date   WBC 5.2 08/28/2018   HGB 12.6 (L) 09/05/2018   HCT 37.0 (L) 09/05/2018   MCV 94 08/28/2018   PLT 250 08/28/2018      Component Value Date/Time   NA 141 09/05/2018 1232   NA 141 08/28/2018 0938   K 3.9 09/05/2018 1232   CL 103 08/28/2018 0938   CO2 25 08/28/2018 0938   GLUCOSE 100 (H) 08/28/2018 0938   BUN 18 08/28/2018 0938   CREATININE 0.94 08/28/2018 0938   CALCIUM 9.2 08/28/2018 0938   GFRNONAA 91 08/28/2018 0938   GFRAA 105 08/28/2018 0938   No results found for: INR, PROTIME No results found for: PTT  SOCIAL HISTORY: Social History   Socioeconomic History  . Marital status: Single    Spouse name: Not on file  . Number  of children: 0  . Years of education: Not on file  . Highest education level: Not on file  Occupational History  . Not on file  Social Needs  . Financial resource strain: Not on file  . Food insecurity:    Worry: Not on file    Inability: Not on file  . Transportation needs:    Medical: Not on file    Non-medical: Not on file  Tobacco Use  . Smoking status: Never Smoker  . Smokeless tobacco: Never Used  Substance and Sexual Activity  . Alcohol use: No  . Drug use: Never  . Sexual activity: Not on file  Lifestyle  . Physical  activity:    Days per week: Not on file    Minutes per session: Not on file  . Stress: Not on file  Relationships  . Social connections:    Talks on phone: Not on file    Gets together: Not on file    Attends religious service: Not on file    Active member of club or organization: Not on file    Attends meetings of clubs or organizations: Not on file    Relationship status: Not on file  . Intimate partner violence:    Fear of current or ex partner: Not on file    Emotionally abused: Not on file    Physically abused: Not on file    Forced sexual activity: Not on file  Other Topics Concern  . Not on file  Social History Narrative  . Not on file    FAMILY HISTORY: Family History  Problem Relation Age of Onset  . Heart failure Mother     REVIEW OF SYSTEMS: Reviewed with the patient as per History of present illness. Psych: Patient denies having dental phobia.  DENTAL HISTORY: CHIEF COMPLAINT: Patient referred by Dr. Cornelius Moras for a dental consultation.  HPI: Derrick Craig. is a 58 year old male recently diagnosed with severe mitral regurgitation. Patient with anticipated mitral valve replacement or repair in the near future. Patient is now seen as part of a medically necessary pre-heart valve surgery dental protocol examination to rule out dental infection that may affect the patient's systemic health and anticipated heart valve surgery.  The patient currently denies acute toothaches, swellings, or abscesses. Patient indicates it has been about 8 years since he was seen by the dentist. Patient saw Dr. Blake Divine at that time for an exam and cleaning. The patient also has a history of several "gum graft" procedures.  Patient had one 15 years ago and then another one 8-9 years ago by Dr. Burgess Estelle.  Patient denies having any missing teeth. Patient denies having any partial dentures. Patient denies having any dental phobia.  DENTAL EXAMINATION: GENERAL:  The patient is a  well-developed, well-nourished male in no acute distress. HEAD AND NECK:  There is no palpable neck lymphadenopathy. The patient denies acute TMJ symptoms. INTRAORAL EXAM:  The patient has normal saliva. The patient has bilateral, multilobular mandibular lingual tori. There is no evidence of oral abscess formation. DENTITION:  The patient is not missing any teeth.  Multiple flexure lesions are noted. Multiple maxillary and mandibular anterior teeth have incisal attrition. PERIODONTAL:  Patient has chronic periodontitis with plaque and calculus accumulations, gingival recession, incipient mandibular anterior tooth mobility. Patient has a history of several free gingival graft procedures by report. Patient indicates his last free gingival graft procedure was performed by Dr. Burgess Estelle. DENTAL CARIES/SUBOPTIMAL RESTORATIONS:  There are no obvious dental caries  noted. Multiple flexure lesions are noted. ENDODONTIC: The patient currently denies acute pulpitis symptoms. I do not see any evidence of periapical pathology or radiolucency. CROWN AND BRIDGE:  There are no crown or bridge restorations. PROSTHODONTIC: There are no partial dentures. ORTHODONTICS: The patient has a history of previous orthodontic therapy. OCCLUSION: The patient has a mandibular anterior crowding but a stable occlusion at this time.  Patient may benefit from occlusal splint therapy.  RADIOGRAPHIC INTERPRETATION: An orthopantogram was taken but was suboptimal secondary to patient movement. This was supplemented with a full series of dental radiographs. There are no missing teeth. There is incipient bone loss. There is no evidence of periapical pathology or radiolucency.  There are bilateral mandibular radiopacities consistent with bilateral mandibular lingual tori. There are multiple foreshortened roots secondary to previous orthodontic therapy.  There is evidence of mandibular anterior crowded dentition. There is evidence of maxillary  and mandibular anterior incisal attrition.   ASSESSMENTS: 1. Severe mitral regurgitation 2. Pre-heart valve surgery dental protocol 3. Chronic periodontitis with bone loss 4. Gingival recession 5. Accretions 6. Mandibular anterior incipient tooth mobility 7. Bilateral, multilobular mandibular lingual tori 8. Multiple flexure lesions 9. History of previous free gingival graft procedures 10. No missing teeth 11. Maxillary and mandibular anterior incisal attrition 12. Lower anterior crowding 13. Poor occlusal scheme but a stable occlusion 14. History of orthodontic therapy with foreshortened roots 15. Need for antibiotic premedication prior to invasive dental procedures after the anticipated mitral valve replacement or repair.   PLAN/RECOMMENDATIONS: 1. I discussed the risks, benefits, and complications of various treatment options with the patient in relationship to his medical and dental conditions and anticipated mitral valve heart surgery.   We discussed various treatment options to include no treatment, periodontal therapy, dental restorations, and crown and bridge therapy, and occlusal splint therapy as indicated. We specifically discussed proceeding ideally with periodontal therapy at this time. However, due to the current Covid 19 precautions and potential risk for infection from the coronavirus, the patient wishes to defer any dental treatment at this time. The patient is aware of the need for future antibiotic premedication prior to invasive dental procedures after the anticipated heart valve surgery.  Patient was also informed of the need to obtain a primary dentist to provide comprehensive dental care and periodontal therapy in the future. Patient will contact Dental Medicine to arrange for transfer of records as indicated.    2. Discussion of findings with medical team and coordination of future medical and dental care as needed.  The patient is currently cleared for anticipated  heart valve surgery as indicated with Dr. Cornelius Moraswen.  I spent in excess of  120 minutes during the conduct of this consultation and >50% of this time involved direct face-to-face encounter for counseling and/or coordination of the patient's care.    Charlynne Panderonald F. Alonzo Owczarzak, DDS

## 2018-11-29 ENCOUNTER — Other Ambulatory Visit: Payer: Self-pay | Admitting: *Deleted

## 2018-11-29 DIAGNOSIS — I340 Nonrheumatic mitral (valve) insufficiency: Secondary | ICD-10-CM

## 2018-11-30 ENCOUNTER — Encounter: Payer: Self-pay | Admitting: *Deleted

## 2018-12-07 ENCOUNTER — Ambulatory Visit
Admission: RE | Admit: 2018-12-07 | Discharge: 2018-12-07 | Disposition: A | Discharge status: Home / Self Care | Payer: Federal, State, Local not specified - PPO | Source: Ambulatory Visit | Attending: Thoracic Surgery (Cardiothoracic Vascular Surgery) | Admitting: Thoracic Surgery (Cardiothoracic Vascular Surgery)

## 2018-12-07 DIAGNOSIS — I340 Nonrheumatic mitral (valve) insufficiency: Secondary | ICD-10-CM

## 2018-12-07 DIAGNOSIS — N200 Calculus of kidney: Secondary | ICD-10-CM | POA: Diagnosis not present

## 2018-12-07 DIAGNOSIS — I341 Nonrheumatic mitral (valve) prolapse: Secondary | ICD-10-CM | POA: Diagnosis not present

## 2018-12-07 MED ORDER — IOPAMIDOL (ISOVUE-370) INJECTION 76%
75.0000 mL | Freq: Once | INTRAVENOUS | Status: AC | PRN
  Administered 2018-12-07: 11:00:00 75 mL via INTRAVENOUS

## 2018-12-18 ENCOUNTER — Ambulatory Visit: Payer: Federal, State, Local not specified - PPO | Admitting: Thoracic Surgery (Cardiothoracic Vascular Surgery)

## 2018-12-18 ENCOUNTER — Encounter: Payer: Self-pay | Admitting: Thoracic Surgery (Cardiothoracic Vascular Surgery)

## 2018-12-18 ENCOUNTER — Other Ambulatory Visit: Payer: Self-pay

## 2018-12-18 VITALS — BP 113/75 | HR 59 | Temp 97.7°F | Resp 16 | Ht 72.0 in | Wt 192.6 lb

## 2018-12-18 DIAGNOSIS — I340 Nonrheumatic mitral (valve) insufficiency: Secondary | ICD-10-CM | POA: Diagnosis not present

## 2018-12-18 DIAGNOSIS — I341 Nonrheumatic mitral (valve) prolapse: Secondary | ICD-10-CM

## 2018-12-18 NOTE — Patient Instructions (Signed)
   Continue taking all current medications without change through the day before surgery.  Have nothing to eat or drink after midnight the night before surgery.  On the morning of surgery take only your eye drops.  Do not take your blood pressure medication

## 2018-12-18 NOTE — Addendum Note (Signed)
Addended by: Rexene Alberts on: 12/18/2018 11:56 AM   Modules accepted: Level of Service

## 2018-12-18 NOTE — Progress Notes (Signed)
[address]       [place] [id number]             [phone]     CARDIOTHORACIC SURGERY OFFICE NOTE  Referring Provider is Yates DecampGanji, Jay, MD PCP is Pearson GrippeKim, James, MD   HPI:  Patient is a 58 year old male with mitral valve prolapse and severe mitral regurgitation who returns the office today for follow-up prior to elective mitral valve repair.  He was originally seen in consultation on Nov 13, 2018.  He returns to the office today and reports no new problems or complaints.  He reports stable symptoms of exertional shortness of breath that occur only with more strenuous physical exertion, New York Heart Association functional class I-II.  He does report that last week he was out working in 90 degree weather in his yard.  He spent 7 hours working in the yard and got a bit overheated and a little bit dizzy.  He did not pass out.  He states that the work he was performing was not strenuous, but he probably got a bit overheated.  He denies any fevers or productive cough.   Current Outpatient Medications  Medication Sig Dispense Refill   latanoprost (XALATAN) 0.005 % ophthalmic solution Place 1 drop into both eyes at bedtime.     lisinopril (PRINIVIL,ZESTRIL) 20 MG tablet Take 20 mg by mouth daily after breakfast.      timolol (TIMOPTIC) 0.5 % ophthalmic solution Place 1 drop into the left eye daily.      No current facility-administered medications for this visit.       Physical Exam:   BP 113/75 (BP Location: Right Arm, Patient Position: Sitting, Cuff Size: Normal)    Pulse (!) 59    Temp 97.7 F (36.5 C) (Skin)    Resp 16    Ht 6' (1.829 m)    Wt 192 lb 9.6 oz (87.4 kg)    SpO2 98% Comment: RA   BMI 26.12 kg/m   General:  Well-appearing  Chest:   Clear to auscultation  CV:   Regular rate and rhythm with prominent holosystolic murmur  Incisions:  n/a  Abdomen:  Soft nontender  Extremities:  Warm and well-perfused  Diagnostic Tests:  CT ANGIOGRAPHY CHEST,  ABDOMEN AND PELVIS  TECHNIQUE: Multidetector CT imaging through the chest, abdomen and pelvis was performed using the standard protocol during bolus administration of intravenous contrast. Multiplanar reconstructed images and MIPs were obtained and reviewed to evaluate the vascular anatomy.  CONTRAST:  75mL ISOVUE-370 IOPAMIDOL (ISOVUE-370) INJECTION 76%  COMPARISON:  CT 08/09/2016, 01/15/2008  FINDINGS: CTA CHEST FINDINGS  Cardiovascular:  Heart:  No cardiomegaly. No pericardial fluid/thickening. No significant coronary calcifications.  Aorta:  Unremarkable course, caliber, contour of the thoracic aorta. No aneurysm or dissection flap. No periaortic fluid. Four vessel arch with separate origin of the left vertebral artery from the aortic arch.  Pulmonary arteries:  Suboptimal bolus timing for evaluation of the pulmonary arteries. Main pulmonary artery diameter unremarkable.  Mediastinum/Nodes: No mediastinal adenopathy. Unremarkable appearance of the thoracic esophagus.  Unremarkable thoracic inlet  Lungs/Pleura: Central airways are clear. No pleural effusion. No confluent airspace disease.  No pneumothorax.  Atelectasis at the lung bases.  Musculoskeletal: No acute displaced fracture. Degenerative changes of the spine.  Review of the MIP images confirms the above findings.  CTA ABDOMEN AND PELVIS FINDINGS  VASCULAR  Aorta: Unremarkable course, caliber, contour of the abdominal aorta. No dissection, aneurysm, or periaortic fluid.  Celiac: No significant atherosclerotic changes at the origin of the celiac artery. Typical branch pattern of the celiac artery, with left gastric, common hepatic, and splenic artery identified.  SMA: No significant atherosclerotic changes of the superior mesenteric artery.  Renals: Renal arteries are patent. Single left and single right renal artery.  IMA: Inferior mesenteric artery is patent. Left  colic artery patent. Superior rectal artery patent.  Right lower extremity:  Unremarkable course, caliber, and contour of the right iliac system. No aneurysm, dissection, or occlusion. Hypogastric artery is patent. Anterior and posterior division patent. Common femoral artery patent. Proximal SFA and profunda femoris patent.  Left lower extremity:  Unremarkable course, caliber, and contour of the left iliac system. No aneurysm, dissection, or occlusion. Hypogastric artery is patent. Anterior and posterior division patent. Common femoral artery patent. Proximal SFA and profunda femoris patent.  Veins: Unremarkable appearance of the venous system.  Review of the MIP images confirms the above findings.  NON-VASCULAR  Hepatobiliary: Redemonstration of small hemangioma of the right liver lobe (image 154 of series 5). Unremarkable gall bladder.  Pancreas: Unremarkable  Spleen: Unremarkable.  Adrenals/Urinary Tract: Unremarkable appearance of adrenal glands.  Right:  No hydronephrosis. Symmetric perfusion to the left. Small nonobstructive nephrolithiasis, unchanged from the comparison study. Largest stone at the lower pole collecting system measures 3 mm. Unremarkable course of the right ureter.  Left:  No hydronephrosis. Symmetric perfusion to the right. Nonobstructive nephrolithiasis of the left kidney, unchanged from prior. Single stone in the interpolar collecting system measures 4 mm. Unremarkable course of the left ureter.  Unremarkable appearance of the urinary bladder .  Stomach/Bowel: Unremarkable appearance of the stomach. Unremarkable appearance of small bowel. No evidence of obstruction. Mild colonic diverticula without evidence of acute diverticulitis. Normal appendix.  Lymphatic: Small nodes of the mesentery without adenopathy.  Mesenteric: No free fluid or air. No adenopathy.  Reproductive: Diameter of the prostate measures 5.5 cm.  Internal calcifications.  Other: No hernia.  Musculoskeletal: No evidence of acute fracture. No bony canal narrowing. No significant degenerative changes of the hips.  IMPRESSION: No acute finding.  No significant atherosclerotic changes.  Redemonstration of right liver hemangioma.  Bilateral nonobstructing nephrolithiasis.  Diverticular disease without evidence of acute diverticulitis.  Signed,  Dulcy Fanny. Dellia Nims, RPVI  [facility]  [facility]   Electronically Signed   By: Corrie Mckusick D.O.   On: 12/07/2018 12:09   Impression:  Patient has mitral valve prolapse with stage D severe symptomatic primary mitral regurgitation.    He presents with stable symptoms of exertional shortness of breath consistent with chronic diastolic congestive heart failure, New York Heart Association functional class I-II.  I have personally reviewed the patient's recent transesophageal echocardiogram and diagnostic cardiac catheterization.  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  There are multiple elongated primary chordae tendinae.  I do not appreciate any ruptured chordae tendinae.  There is at least moderate and probably severe central mitral regurgitation.  There is mild left ventricular chamber enlargement and moderate left atrial enlargement.  No other significant abnormalities are noted.  Diagnostic cardiac catheterization is notable for the absence of significant coronary artery disease.  Right heart pressures were normal.  I agree the patient would best be treated with elective mitral valve repair.  Based upon review of the patient's transesophageal echocardiogram I feel there is a greater than 95% likelihood  of successful and durable mitral valve repair with very low operative risk.  The  patient appears to be good candidate for minimally invasive approach for surgery.  CT angiography revealed no contraindications to peripheral cannulation for surgery.   Plan:   The patient was again counseled at length regarding the indications, risks and potential benefits of mitral valve repair.  The rationale for elective surgery has been explained, including a comparison between surgery and continued medical therapy with close follow-up.  The likelihood of successful and durable valve repair has been discussed with particular reference to the findings of their recent echocardiogram.  Based upon these findings and previous experience, I have quoted them a greater than 95 percent likelihood of successful valve repair.  Alternative surgical approaches have been discussed including a comparison between conventional sternotomy and minimally-invasive techniques.  Expectations for the patient's postoperative convalescence have been discussed.  We also discussed the fact that the patient lives alone and does not have any immediate family who lives nearby.  It is unclear whether or not he will have 24 hours a day support and he may need temporary placement in a rehab facility for his early convalescence.  The patient understands and accepts all potential risks of surgery including but not limited to risk of death, stroke or other neurologic complication, myocardial infarction, congestive heart failure, respiratory failure, renal failure, bleeding requiring transfusion and/or reexploration, arrhythmia, infection or other wound complications, pneumonia, pleural and/or pericardial effusion, pulmonary embolus, aortic dissection or other major vascular complication, or delayed complications related to valve repair or replacement including but not limited to structural valve deterioration and failure, thrombosis, embolization, endocarditis, or paravalvular leak.  Specific risks potentially related to the  minimally-invasive approach were discussed at length, including but not limited to risk of conversion to full or partial sternotomy, aortic dissection or other major vascular complication, unilateral acute lung injury or pulmonary edema, phrenic nerve dysfunction or paralysis, rib fracture, chronic pain, lung hernia, or lymphocele. All of his questions have been answered.   I spent in excess of 30 minutes during the conduct of this office consultation and >50% of this time involved direct face-to-face encounter with the patient for counseling and/or coordination of their care.    Salvatore Decentlarence H. Cornelius Moraswen, MD 12/18/2018 11:05 AM

## 2018-12-21 NOTE — Pre-Procedure Instructions (Signed)
[id number] [place]- [place],  - [address] AT [facility] [address] [place] [id number] Phone: [phone] Fax: [phone]      Your procedure is scheduled on Tuesday, June 16th.  [facility] at 5:30 A.M., and check in at the Admitting office.  Call this number if you have problems the morning of surgery:  [phone]  Call [phone] if you have any questions prior to your surgery date Monday-Friday 8am-4pm    Remember:  Do not eat or drink after midnight.   Take these medicines the morning of surgery with A SIP OF WATER: NONE  timolol (TIMOPTIC) eye drops.  As of today, STOP taking any Aspirin (unless otherwise instructed by your surgeon), Aleve, Naproxen, Ibuprofen, Motrin, Advil, Goody's, BC's, all herbal medications, fish oil, and all vitamins.    The Morning of Surgery  Do not wear jewelry.  Do not wear lotions, powders, or colognes, or deodorant  Do not shave 48 hours prior to surgery.  Men may shave face and neck.  Do not bring valuables to the hospital.  [facility] is not responsible for any belongings or valuables.  If you are a smoker, DO NOT Smoke 24 hours prior to surgery IF you wear a CPAP at night please bring your mask, tubing, and machine the morning of surgery   Remember that you must have someone to transport you home after your surgery, and remain with you for 24 hours if you are discharged the same day.   Contacts, glasses, hearing aids, dentures or bridgework may not be worn into surgery.    Leave your suitcase in the car.  After surgery it may be brought to your room.  For patients admitted to the hospital, discharge time will be determined by your treatment team.  Patients discharged the day of surgery will not be allowed to drive home.    Special instructions:   - Preparing For Surgery  Before surgery, you can play an important role. Because skin  is not sterile, your skin needs to be as free of germs as possible. You can reduce the number of germs on your skin by washing with CHG (chlorahexidine gluconate) Soap before surgery.  CHG is an antiseptic cleaner which kills germs and bonds with the skin to continue killing germs even after washing.    Oral Hygiene is also important to reduce your risk of infection.  Remember - BRUSH YOUR TEETH THE MORNING OF SURGERY WITH YOUR REGULAR TOOTHPASTE  Please do not use if you have an allergy to CHG or antibacterial soaps. If your skin becomes reddened/irritated stop using the CHG.  Do not shave (including legs and underarms) for at least 48 hours prior to first CHG shower. It is OK to shave your face.  Please follow these instructions carefully.   1. Shower the NIGHT BEFORE SURGERY and the MORNING OF SURGERY with CHG Soap.   2. If you chose to wash your hair, wash your hair first as usual with your normal shampoo.  3. After you shampoo, rinse your hair and body thoroughly to remove the shampoo.  4. Use CHG as you would any other liquid soap. You can apply CHG directly to the skin and wash gently with a scrungie or a clean washcloth.   5. Apply the CHG Soap to your body ONLY FROM THE NECK DOWN.  Do not use on open wounds or open sores.  Avoid contact with your eyes, ears, mouth and genitals (private parts). Wash Face and genitals (private parts)  with your normal soap.   6. Wash thoroughly, paying special attention to the area where your surgery will be performed.  7. Thoroughly rinse your body with warm water from the neck down.  8. DO NOT shower/wash with your normal soap after using and rinsing off the CHG Soap.  9. Pat yourself dry with a CLEAN TOWEL.  10. Wear CLEAN PAJAMAS to bed the night before surgery, wear comfortable clothes the morning of surgery  11. Place CLEAN SHEETS on your bed the night of your first shower and DO NOT SLEEP WITH PETS.    Day of Surgery:  Do not apply any  deodorants/lotions.  Please wear clean clothes to the hospital/surgery center.   Remember to brush your teeth WITH YOUR REGULAR TOOTHPASTE.   Please read over the following fact sheets that you were given.

## 2018-12-22 ENCOUNTER — Other Ambulatory Visit: Payer: Self-pay

## 2018-12-22 ENCOUNTER — Other Ambulatory Visit (HOSPITAL_COMMUNITY)
Admission: RE | Admit: 2018-12-22 | Discharge: 2018-12-22 | Disposition: A | Discharge status: Home / Self Care | Payer: Federal, State, Local not specified - PPO | Source: Ambulatory Visit | Attending: Thoracic Surgery (Cardiothoracic Vascular Surgery) | Admitting: Thoracic Surgery (Cardiothoracic Vascular Surgery)

## 2018-12-22 ENCOUNTER — Encounter (HOSPITAL_COMMUNITY): Payer: Self-pay

## 2018-12-22 ENCOUNTER — Ambulatory Visit (HOSPITAL_COMMUNITY)
Admission: RE | Admit: 2018-12-22 | Discharge: 2018-12-22 | Disposition: A | Discharge status: Home / Self Care | Payer: Federal, State, Local not specified - PPO | Source: Ambulatory Visit | Attending: Thoracic Surgery (Cardiothoracic Vascular Surgery) | Admitting: Thoracic Surgery (Cardiothoracic Vascular Surgery)

## 2018-12-22 ENCOUNTER — Encounter (HOSPITAL_COMMUNITY)
Admission: RE | Admit: 2018-12-22 | Discharge: 2018-12-22 | Disposition: A | Discharge status: Home / Self Care | Payer: Federal, State, Local not specified - PPO | Source: Ambulatory Visit | Attending: Thoracic Surgery (Cardiothoracic Vascular Surgery) | Admitting: Thoracic Surgery (Cardiothoracic Vascular Surgery)

## 2018-12-22 DIAGNOSIS — I340 Nonrheumatic mitral (valve) insufficiency: Secondary | ICD-10-CM

## 2018-12-22 DIAGNOSIS — Z1159 Encounter for screening for other viral diseases: Secondary | ICD-10-CM | POA: Insufficient documentation

## 2018-12-22 DIAGNOSIS — Z01811 Encounter for preprocedural respiratory examination: Secondary | ICD-10-CM | POA: Diagnosis not present

## 2018-12-22 DIAGNOSIS — Z01812 Encounter for preprocedural laboratory examination: Secondary | ICD-10-CM | POA: Insufficient documentation

## 2018-12-22 HISTORY — DX: Personal history of urinary calculi: Z87.442

## 2018-12-22 LAB — BLOOD GAS, ARTERIAL
Acid-Base Excess: 0 mmol/L (ref 0.0–2.0)
Bicarbonate: 24.3 mmol/L (ref 20.0–28.0)
Drawn by: 265211
FIO2: 21
O2 Saturation: 98 %
Patient temperature: 98.6
pCO2 arterial: 40.6 mmHg (ref 32.0–48.0)
pH, Arterial: 7.395 (ref 7.350–7.450)
pO2, Arterial: 107 mmHg (ref 83.0–108.0)

## 2018-12-22 LAB — CBC
HCT: 43.2 % (ref 39.0–52.0)
Hemoglobin: 14.3 g/dL (ref 13.0–17.0)
MCH: 31.4 pg (ref 26.0–34.0)
MCHC: 33.1 g/dL (ref 30.0–36.0)
MCV: 94.9 fL (ref 80.0–100.0)
Platelets: 242 10*3/uL (ref 150–400)
RBC: 4.55 MIL/uL (ref 4.22–5.81)
RDW: 12 % (ref 11.5–15.5)
WBC: 4.9 10*3/uL (ref 4.0–10.5)
nRBC: 0 % (ref 0.0–0.2)

## 2018-12-22 LAB — TYPE AND SCREEN
ABO/RH(D): A POS
Antibody Screen: NEGATIVE

## 2018-12-22 LAB — COMPREHENSIVE METABOLIC PANEL
ALT: 30 U/L (ref 0–44)
AST: 27 U/L (ref 15–41)
Albumin: 3.8 g/dL (ref 3.5–5.0)
Alkaline Phosphatase: 59 U/L (ref 38–126)
Anion gap: 7 (ref 5–15)
BUN: 25 mg/dL — ABNORMAL HIGH (ref 6–20)
CO2: 26 mmol/L (ref 22–32)
Calcium: 9 mg/dL (ref 8.9–10.3)
Chloride: 104 mmol/L (ref 98–111)
Creatinine, Ser: 0.82 mg/dL (ref 0.61–1.24)
GFR calc Af Amer: >60 mL/min (ref >60)
GFR calc non Af Amer: >60 mL/min (ref >60)
Glucose, Bld: 87 mg/dL (ref 70–99)
Potassium: 4.4 mmol/L (ref 3.5–5.1)
Sodium: 137 mmol/L (ref 135–145)
Total Bilirubin: 0.6 mg/dL (ref 0.3–1.2)
Total Protein: 6.5 g/dL (ref 6.5–8.1)

## 2018-12-22 LAB — URINALYSIS, ROUTINE W REFLEX MICROSCOPIC
Bilirubin Urine: NEGATIVE
Glucose, UA: NEGATIVE mg/dL
Hgb urine dipstick: NEGATIVE
Ketones, ur: NEGATIVE mg/dL
Leukocytes,Ua: NEGATIVE
Nitrite: NEGATIVE
Protein, ur: NEGATIVE mg/dL
Specific Gravity, Urine: 1.01 (ref 1.005–1.030)
pH: 5 (ref 5.0–8.0)

## 2018-12-22 LAB — HEMOGLOBIN A1C
Hgb A1c MFr Bld: 5.2 % (ref 4.8–5.6)
Mean Plasma Glucose: 102.54 mg/dL

## 2018-12-22 LAB — APTT: aPTT: 31 seconds (ref 24–36)

## 2018-12-22 LAB — PROTIME-INR
INR: 1 (ref 0.8–1.2)
Prothrombin Time: 13.3 seconds (ref 11.4–15.2)

## 2018-12-22 LAB — ABO/RH: ABO/RH(D): A POS

## 2018-12-22 LAB — SURGICAL PCR SCREEN
MRSA, PCR: NEGATIVE
Staphylococcus aureus: NEGATIVE

## 2018-12-22 NOTE — Progress Notes (Addendum)
Pre op vascular ultrasound study      has been completed. Preliminary results can be found under CV proc through chart review. June Leap, BS, RDMS, RVT

## 2018-12-22 NOTE — Progress Notes (Signed)
PCP - Dr. Jani Gravel Cardiologist - Dr. Adrian Prows  Chest x-ray - 12/22/18 EKG - 12/22/18 Stress Test - 02/16/16 ECHO - 09/05/18 Cardiac Cath - 09/05/18  Sleep Study - denies CPAP - denies  Blood Thinner Instructions: N/A Aspirin Instructions:N/A  Anesthesia review: Yes, heart history  Patient denies shortness of breath, fever, cough and chest pain at PAT appointment   Patient verbalized understanding of instructions that were given to them at the PAT appointment. Patient was also instructed that they will need to review over the PAT instructions again at home before surgery.  Scheduled for Covid testing today after PAT appointment.    Coronavirus Screening  Have you experienced the following symptoms:  Cough yes/no: No Fever (>100.71F)  yes/no: No Runny nose yes/no: No Sore throat yes/no: No Difficulty breathing/shortness of breath  yes/no: No  Have you or a family member traveled in the last 14 days and where? yes/no: No   If the patient indicates "YES" to the above questions, their PAT will be rescheduled to limit the exposure to others and, the surgeon will be notified. THE PATIENT WILL NEED TO BE ASYMPTOMATIC FOR 14 DAYS.   If the patient is not experiencing any of these symptoms, the PAT nurse will instruct them to NOT bring anyone with them to their appointment since they may have these symptoms or traveled as well.   Please remind your patients and families that hospital visitation restrictions are in effect and the importance of the restrictions.

## 2018-12-23 LAB — NOVEL CORONAVIRUS, NAA (HOSP ORDER, SEND-OUT TO REF LAB; TAT 18-24 HRS): SARS-CoV-2, NAA: NOT DETECTED

## 2018-12-25 MED ORDER — SODIUM CHLORIDE 0.9 % IV SOLN
INTRAVENOUS | Status: DC
  Filled 2018-12-25: qty 30

## 2018-12-25 MED ORDER — DEXMEDETOMIDINE HCL IN NACL 400 MCG/100ML IV SOLN
0.1000 ug/kg/h | INTRAVENOUS | Status: DC
  Filled 2018-12-25: qty 100

## 2018-12-25 MED ORDER — VANCOMYCIN HCL 1000 MG IV SOLR
INTRAVENOUS | Status: AC
  Administered 2018-12-26: 1000 mL
  Filled 2018-12-25: qty 1000

## 2018-12-25 MED ORDER — SODIUM CHLORIDE 0.9 % IV SOLN
750.0000 mg | INTRAVENOUS | Status: DC
  Filled 2018-12-25: qty 750

## 2018-12-25 MED ORDER — TRANEXAMIC ACID (OHS) BOLUS VIA INFUSION
15.0000 mg/kg | INTRAVENOUS | Status: AC
  Administered 2018-12-26: 1327.5 mg via INTRAVENOUS
  Filled 2018-12-25: qty 1328

## 2018-12-25 MED ORDER — INSULIN REGULAR(HUMAN) IN NACL 100-0.9 UT/100ML-% IV SOLN
INTRAVENOUS | Status: AC
  Administered 2018-12-26: 1.2 [IU]/h via INTRAVENOUS
  Filled 2018-12-25: qty 100

## 2018-12-25 MED ORDER — VANCOMYCIN HCL 10 G IV SOLR
1500.0000 mg | INTRAVENOUS | Status: AC
  Administered 2018-12-26: 1500 mg via INTRAVENOUS
  Filled 2018-12-25: qty 1500

## 2018-12-25 MED ORDER — POTASSIUM CHLORIDE 2 MEQ/ML IV SOLN
80.0000 meq | INTRAVENOUS | Status: DC
  Filled 2018-12-25: qty 40

## 2018-12-25 MED ORDER — SODIUM CHLORIDE 0.9 % IV SOLN
1.5000 g | INTRAVENOUS | Status: AC
  Administered 2018-12-26: 1.5 g via INTRAVENOUS
  Filled 2018-12-25: qty 1.5

## 2018-12-25 MED ORDER — EPINEPHRINE PF 1 MG/ML IJ SOLN
0.0000 ug/min | INTRAVENOUS | Status: DC
  Filled 2018-12-25: qty 4

## 2018-12-25 MED ORDER — MAGNESIUM SULFATE 50 % IJ SOLN
40.0000 meq | INTRAMUSCULAR | Status: DC
  Filled 2018-12-25: qty 9.85

## 2018-12-25 MED ORDER — PLASMA-LYTE 148 IV SOLN
INTRAVENOUS | Status: AC
  Administered 2018-12-26: 500 mL
  Filled 2018-12-25: qty 2.5

## 2018-12-25 MED ORDER — DOPAMINE-DEXTROSE 3.2-5 MG/ML-% IV SOLN
0.0000 ug/kg/min | INTRAVENOUS | Status: DC
  Filled 2018-12-25: qty 250

## 2018-12-25 MED ORDER — KENNESTONE BLOOD CARDIOPLEGIA VIAL
13.0000 mL | Status: DC
  Filled 2018-12-25: qty 13

## 2018-12-25 MED ORDER — MILRINONE LACTATE IN DEXTROSE 20-5 MG/100ML-% IV SOLN
0.3000 ug/kg/min | INTRAVENOUS | Status: DC
  Filled 2018-12-25: qty 100

## 2018-12-25 MED ORDER — PHENYLEPHRINE HCL-NACL 20-0.9 MG/250ML-% IV SOLN
30.0000 ug/min | INTRAVENOUS | Status: AC
  Administered 2018-12-26: 25 ug/min via INTRAVENOUS
  Administered 2018-12-26: 10 ug/min via INTRAVENOUS
  Filled 2018-12-25: qty 250

## 2018-12-25 MED ORDER — KENNESTONE BLOOD CARDIOPLEGIA (KBC) MANNITOL SYRINGE (20%, 32ML)
32.0000 mL | INTRAVENOUS | Status: DC
  Filled 2018-12-25: qty 32

## 2018-12-25 MED ORDER — TRANEXAMIC ACID 1000 MG/10ML IV SOLN
1.5000 mg/kg/h | INTRAVENOUS | Status: AC
  Administered 2018-12-26: 1.5 mg/kg/h via INTRAVENOUS
  Filled 2018-12-25: qty 25

## 2018-12-25 MED ORDER — NOREPINEPHRINE 4 MG/250ML-% IV SOLN
0.0000 ug/min | INTRAVENOUS | Status: DC
  Filled 2018-12-25: qty 250

## 2018-12-25 MED ORDER — TRANEXAMIC ACID (OHS) PUMP PRIME SOLUTION
2.0000 mg/kg | INTRAVENOUS | Status: DC
  Filled 2018-12-25: qty 1.77

## 2018-12-25 MED ORDER — NITROGLYCERIN IN D5W 200-5 MCG/ML-% IV SOLN
2.0000 ug/min | INTRAVENOUS | Status: DC
  Filled 2018-12-25: qty 250

## 2018-12-25 NOTE — Progress Notes (Signed)
Anesthesia Chart Review:  Case: [id number] Date/Time: 12/26/18 0715   Procedures:      MINIMALLY INVASIVE MITRAL VALVE REPAIR (MVR) (Right Chest)     TRANSESOPHAGEAL ECHOCARDIOGRAM (TEE) (N/A )   Anesthesia type: General   Pre-op diagnosis: MR   Location: [facility] OR ROOM 15 / [place] [facility]   Surgeon: Rexene Alberts, MD      DISCUSSION: Patient is a 58 year old male scheduled for the above procedure.  History includes never smoker, MVP/severe MR, glaucoma. He had a very small patent foramen ovale by 09/05/18 TEE.  12/22/18 COVID 19 test negative.   VS: BP 124/67   Pulse 60   Temp 36.6 C   Resp 20   Ht 6' (1.829 m)   Wt 88.5 kg   SpO2 100%   BMI 26.47 kg/m    PROVIDERS: Jani Gravel, MD is PCP Adrian Prows, MD is cardiologist   LABS: Labs reviewed: Acceptable for surgery. (all labs ordered are listed, but only abnormal results are displayed)  Labs Reviewed  COMPREHENSIVE METABOLIC PANEL - Abnormal; Notable for the following components:      Result Value   BUN 25 (*)    All other components within normal limits  URINALYSIS, ROUTINE W REFLEX MICROSCOPIC - Abnormal; Notable for the following components:   Color, Urine STRAW (*)    All other components within normal limits  SURGICAL PCR SCREEN  APTT  BLOOD GAS, ARTERIAL  CBC  HEMOGLOBIN A1C  PROTIME-INR  TYPE AND SCREEN  ABO/RH    IMAGES: CXR 12/22/18: IMPRESSION: No active cardiopulmonary disease.   EKG: 12/22/18: Sinus bradycardia Inferior infarct , age undetermined Abnormal ECG No previous ECGs available Confirmed by Cherlynn Kaiser [phone]) on 12/22/2018 6:47:17 PM   CV: Carotid US 12/22/18: Summary: Right Carotid: The extracranial vessels were near-normal with only minimal wall                thickening or plaque. Left Carotid: The extracranial vessels were near-normal with only minimal wall               thickening or plaque.  TEE 09/05/18: IMPRESSIONS  1. The left ventricle has normal systolic function, with  an ejection fraction of 55-60%. The cavity size was mildly dilated. Left ventricular diastology could not be evaluated No evidence of left ventricular regional wall motion abnormalities.  2. Left atrial size was moderately dilated.  3. The mitral valve is myxomatous. Mild thickening of the mitral valve leaflet. Mild prolapse of the anterior (A2) and posterior (P2) MV leaflets, predominantly P2 segment. Mitral valve regurgitation is moderate to severe by color flow Doppler. The MR  jet is anteriorly-directed. There was flow reversal in the left lower pulmonary vein and right pulmonary vein.  4. The tricuspid valve was normal in structure.  5. The aortic valve is tricuspid Aortic valve regurgitation is trivial by color flow Doppler.  6. The pulmonic valve was normal in structure.  7. The aortic root, ascending aorta, aortic arch and descending aorta are normal in size and structure.  8. Very Small patent foramen ovale with predominantly left to right shunting across the atrial septum noted by color Doppler. Double contrast study not performed.  9. No evidence of left ventricular regional wall motion abnormalities.  RHC/LHC 09/05/18: Normal coronary arteries with superdominant right coronary artery No coronary artery disease Normal filling pressures. Recommendation: Follow up with CVTS re: management of severe mitral regrgitation   Past Medical History:  Diagnosis Date  . Glaucoma   .  Heart valve problem   . History of kidney stones   . MVP (mitral valve prolapse)   . Severe mitral valve regurgitation     Past Surgical History:  Procedure Laterality Date  . RIGHT/LEFT HEART CATH AND CORONARY ANGIOGRAPHY N/A 09/05/2018   Procedure: RIGHT/LEFT HEART CATH AND CORONARY ANGIOGRAPHY;  Surgeon: Elder NegusPatwardhan, Manish J, MD;  Location: [facility] INVASIVE CV LAB;  Service: Cardiovascular;  Laterality: N/A;  . TEE WITHOUT CARDIOVERSION N/A 09/05/2018   Procedure: TRANSESOPHAGEAL ECHOCARDIOGRAM (TEE);   Surgeon: Yates DecampGanji, Jay, MD;  [facility];  Service: Cardiovascular;  Laterality: N/A;  cath to follow  . TONSILLECTOMY      MEDICATIONS: . latanoprost (XALATAN) 0.005 % ophthalmic solution  . lisinopril (PRINIVIL,ZESTRIL) 20 MG tablet  . timolol (TIMOPTIC) 0.5 % ophthalmic solution   No current facility-administered medications for this encounter.     Shonna ChockAllison Nerissa Constantin, PA-C Surgical Short Stay/Anesthesiology [facility] Phone [phone] [facility] Phone [phone] 12/25/2018 10:54 AM

## 2018-12-25 NOTE — Anesthesia Preprocedure Evaluation (Addendum)
Anesthesia Evaluation  Patient identified by MRN, date of birth, ID band Patient awake    Reviewed: Allergy & Precautions, NPO status , Patient's Chart, lab work & pertinent test results  History of Anesthesia Complications Negative for: history of anesthetic complications  Airway Mallampati: II  TM Distance: <3 FB Neck ROM: Full    Dental  (+) Teeth Intact, Dental Advisory Given   Pulmonary  12/22/2018 SARS coronavirus NEG   Pulmonary exam normal breath sounds clear to auscultation       Cardiovascular hypertension, Pt. on medications (-) angina(-) CAD + Valvular Problems/Murmurs MR  Rhythm:Regular Rate:Normal + Systolic murmurs 10/979 cath: normal coronaries 08/2018 TEE: EF 55-60%, The mitral valve is myxomatous. Mild thickening of the mitral valve leaflet. Mild prolapse of the anterior (A2) and posterior (P2) MV leaflets, predominantly P2 segment. Mitral valve regurgitation is moderate to severe by color flow Doppler. The MR jet is anteriorly-directed. There was flow reversal in the left lower pulmonary vein and right pulmonary vein, small PFO   Neuro/Psych glaucoma negative psych ROS   GI/Hepatic negative GI ROS, Neg liver ROS,   Endo/Other  negative endocrine ROS  Renal/GU negative Renal ROS     Musculoskeletal negative musculoskeletal ROS (+)   Abdominal   Peds  Hematology negative hematology ROS (+)   Anesthesia Other Findings Day of surgery medications reviewed with the patient.  Reproductive/Obstetrics                           Anesthesia Physical Anesthesia Plan  ASA: IV  Anesthesia Plan: General   Post-op Pain Management:    Induction: Intravenous  PONV Risk Score and Plan: 2 and Treatment may vary due to age or medical condition  Airway Management Planned: Oral ETT and Double Lumen EBT  Additional Equipment: Arterial line, CVP, PA Cath, TEE, 3D TEE and Ultrasound Guidance  Line Placement  Intra-op Plan:   Post-operative Plan: Possible Post-op intubation/ventilation  Informed Consent: I have reviewed the patients History and Physical, chart, labs and discussed the procedure including the risks, benefits and alternatives for the proposed anesthesia with the patient or authorized representative who has indicated his/her understanding and acceptance.     Dental advisory given  Plan Discussed with: CRNA and Surgeon  Anesthesia Plan Comments: (PAT note written 12/25/2018 by Myra Gianotti, PA-C. )      Anesthesia Quick Evaluation

## 2018-12-25 NOTE — H&P (Signed)
[address]       [name] [address]             [phone]          CARDIOTHORACIC SURGERY HISTORY AND PHYSICAL EXAM  Primary Cardiologist is [name], MD PCP is Pearson Grippe, MD      Chief Complaint  Patient presents with   Mitral Regurgitation    CATH/TEE 09/05/18    HPI:  Patient is a 58 year old male with history of mitral valve prolapse who has been referred for surgical consultation to discuss treatment options for management of severe symptomatic primary mitral regurgitation.  Patient states that he was first noted to have a heart murmur on physical exam several years ago.  He was referred to Dr. Jacinto Halim who has been following him closely ever since.  Transthoracic echocardiograms have documented the presence of mitral valve prolapse with mitral regurgitation that is gradually progressed in severity.  Most recent follow-up echocardiogram performed August 15, 2018 revealed significant left ventricular chamber enlargement.  The patient subsequently underwent transesophageal echocardiogram and diagnostic cardiac catheterization on September 05, 2018.  Catheterization revealed normal coronary arteries with normal right heart pressures.  TEE confirmed the presence of mitral valve prolapse with moderate to severe mitral regurgitation and mild left ventricular chamber enlargement.  The patient was referred for surgical consultation but his consultation was initially delayed because of the COVID-19 pandemic.  In recent weeks the patient has become increasingly anxious and he requested an in person office consultation visit.  The patient is single and lives alone in [place].  He works full-time for the [employer].  He works at the large [employer] which requires fairly strenuous exertion.  He has noticed over the past few years gradual progression of symptoms of exertional shortness of breath.  Symptoms occur only with more strenuous  exertion.  He states that when he mows his lawn during that hot summer he now has to stop take breaks.  Symptoms do not occur with low-level activity or at rest.  He specifically denies any history of resting shortness of breath, PND, orthopnea, or lower extremity edema.  He has not had any chest pain or chest tightness either with activity or at rest.  Denies any palpitations, dizzy spells, or syncope.  He reports no other significant physical problems and he remains relatively active physically and without other significant physical limitations.  Patient is a 58 year old male with mitral valve prolapse and severe mitral regurgitation who returns the office today for follow-up prior to elective mitral valve repair.  He was originally seen in consultation on Nov 13, 2018.  He returns to the office today and reports no new problems or complaints.  He reports stable symptoms of exertional shortness of breath that occur only with more strenuous physical exertion, New York Heart Association functional class I-II.  He does report that last week he was out working in 90 degree weather in his yard.  He spent 7 hours working in the yard and got a bit overheated and a little bit dizzy.  He did not pass out.  He states that the work he was performing was not strenuous, but he probably got a bit overheated.  He denies any fevers or productive cough.  Past Medical History:  Diagnosis Date   Glaucoma    Heart valve problem    History of kidney stones    MVP (mitral valve prolapse)  Severe mitral valve regurgitation     Past Surgical History:  Procedure Laterality Date   RIGHT/LEFT HEART CATH AND CORONARY ANGIOGRAPHY N/A 09/05/2018   Procedure: RIGHT/LEFT HEART CATH AND CORONARY ANGIOGRAPHY;  Surgeon: Elder NegusPatwardhan, Manish J, MD;  Location: [facility];  Service: Cardiovascular;  Laterality: N/A;   TEE WITHOUT CARDIOVERSION N/A 09/05/2018   Procedure: TRANSESOPHAGEAL ECHOCARDIOGRAM (TEE);  Surgeon:  Yates DecampGanji, Jay, MD;  [facility];  Service: Cardiovascular;  Laterality: N/A;  cath to follow   TONSILLECTOMY      Family History  Problem Relation Age of Onset   Heart failure Mother     Social History Social History   Tobacco Use   Smoking status: Never Smoker   Smokeless tobacco: Never Used  Substance Use Topics   Alcohol use: No   Drug use: Never    Prior to Admission medications   Medication Sig Start Date End Date Taking? Authorizing Provider  latanoprost (XALATAN) 0.005 % ophthalmic solution Place 1 drop into both eyes at bedtime.   Yes [provider]  lisinopril (PRINIVIL,ZESTRIL) 20 MG tablet Take 20 mg by mouth daily after breakfast.  06/09/18  Yes [provider]  timolol (TIMOPTIC) 0.5 % ophthalmic solution Place 1 drop into the left eye daily.  05/29/18  Yes [provider]    No Known Allergies  Review of Systems:              General:                      normal appetite, normal energy, no weight gain, + weight loss, no fever             Cardiac:                       no chest pain with exertion, no chest pain at rest, + SOB with exertion, no resting SOB, no PND, no orthopnea, no palpitations, no arrhythmia, no atrial fibrillation, no LE edema, no dizzy spells, no syncope             Respiratory:                 + exertional shortness of breath, no home oxygen, no productive cough, no dry cough, no bronchitis, no wheezing, no hemoptysis, no asthma, no pain with inspiration or cough, no sleep apnea, no CPAP at night             GI:                               no difficulty swallowing, no reflux, no frequent heartburn, no hiatal hernia, no abdominal pain, no constipation, no diarrhea, no hematochezia, no hematemesis, no melena             GU:                              no dysuria,  no frequency, no urinary tract infection, no hematuria, no enlarged prostate, no kidney stones, no kidney disease             Vascular:                      no pain suggestive of claudication, no pain in feet, no leg cramps, no varicose veins, no DVT, no non-healing  foot ulcer             Neuro:                         no stroke, no TIA's, no seizures, no headaches, no temporary blindness one eye,  no slurred speech, no peripheral neuropathy, no chronic pain, no instability of gait, no memory/cognitive dysfunction             Musculoskeletal:         no arthritis, no joint swelling, no myalgias, no difficulty walking, normal mobility              Skin:                            no rash, no itching, no skin infections, no pressure sores or ulcerations             Psych:                         no anxiety, no depression, no nervousness, no unusual recent stress             Eyes:                           no blurry vision, no floaters, no recent vision changes, + wears glasses or contacts             ENT:                            no hearing loss, no loose or painful teeth, no dentures, last saw dentist many years ago             Hematologic:               no easy bruising, no abnormal bleeding, no clotting disorder, no frequent epistaxis             Endocrine:                   no diabetes, does not check CBG's at home                           Physical Exam:              BP 110/68 (BP Location: Right Arm, Patient Position: Sitting, Cuff Size: Large) Comment: MANUALLY   Pulse (!) 55    Temp 97.9 F (36.6 C) (Temporal)    Resp 16    Wt 193 lb 3.2 oz (87.6 kg)    SpO2 98% Comment: ON RA   BMI 26.20 kg/m              General:                      Thin,  well-appearing             HEENT:                       Unremarkable              Neck:  no JVD, no bruits, no adenopathy              Chest:                          clear to auscultation, symmetrical breath sounds, no wheezes, no rhonchi              CV:                              RRR, grade II/VI systolic murmur              Abdomen:                    soft,  non-tender, no masses              Extremities:                 warm, well-perfused, pulses palpable, no LE edema             Rectal/GU                   Deferred             Neuro:                         Grossly non-focal and symmetrical throughout             Skin:                            Clean and dry, no rashes, no breakdown   Diagnostic Tests:  TRANSTHORACIC ECHOCARDIOGRAM  Echocardiogram 08/15/2018: Left ventricle cavity is mildly dilated at 6.1 cm and LVIDS is normal at 41.cm. Mild concentric hypertrophy of the left ventricle. Normal global wall motion. Normal diastolic filling pattern. Calculated EF 61%. Left atrial cavity is modearely dilated by volume at 52mL and measures 4cm in long axis. The interatrial Septum is thin and mobile but appears to be intact by 2D and CF Doppler interrogation. Trace aortic regurgitation. Moderately severe posteriorly directed (Grade III) mitral regurgitation. Moderate mitral valve prolapse with symmetric leaflets. Mild Myxomatous degeneration of the MV leaflets. Trace tricuspid regurgitation. Inadequate tricuspid regurgitation jet to estimate pulmonary artery pressure. To the study done on 08/30/2017: Left ventricle is now mildly dilated compared to normal.     TRANSESOPHOGEAL ECHO REPORT   Patient Name: Derrick Craig. Date of Exam: 09/05/2018 Medical Rec #: [id number] Height: 72.0 in Accession #: [id number] Weight: 205.1 lb Date of Birth: 1961/07/08 BSA: 2.15 m Patient Age: 41 years BP: 148/76 mmHg Patient Gender: M HR: 68 bpm. Exam Location: Inpatient   Procedure: Transesophageal Echo  Indications: Mitral regurgiation  History: Patient has no prior history of Echocardiogram examinations. Mitral Valve Prolapse and Mitral Valve Disease;  Signs/Symptoms: Dyspnea on exertion.  Sonographer: Ross Ludwig RDCS (AE) Referring Phys: [id number] [name] Diagnosing Phys: [name] MD    PROCEDURE: Normal Transesophogeal exam. Patients was under conscious sedation during this procedure. Anesthetic was administered intravenously by performing Physician: of Fentanyl, 2.0mg  of Versed. The patient developed no complications during the  procedure. 18 minutes of moderate sedation observation and management.  IMPRESSIONS   1. The left ventricle has normal systolic function, with an ejection fraction of 55-60%. The cavity size was mildly dilated. Left  ventricular diastology could not be evaluated No evidence of left ventricular regional wall motion abnormalities. 2. Left atrial size was moderately dilated. 3. The mitral valve is myxomatous. Mild thickening of the mitral valve leaflet. Mild prolapse of the anterior (A2) and posterior (P2) MV leaflets, predominantly P2 segment. Mitral valve regurgitation is moderate to severe by color flow Doppler. The MR  jet is anteriorly-directed. There was flow reversal in the left lower pulmonary vein and right pulmonary vein. 4. The tricuspid valve was normal in structure. 5. The aortic valve is tricuspid Aortic valve regurgitation is trivial by color flow Doppler. 6. The pulmonic valve was normal in structure. 7. The aortic root, ascending aorta, aortic arch and descending aorta are normal in size and structure. 8. Very Small patent foramen ovale with predominantly left to right shunting across the atrial septum noted by color Doppler. Double contrast study not performed. 9. No evidence of left ventricular regional wall motion abnormalities.  FINDINGS Left Ventricle: The left ventricle has normal systolic function, with an ejection fraction of 55-60%. The cavity size was mildly dilated. There is no increase in left ventricular wall thickness. Left ventricular  diastology could not be evaluated No  evidence of left ventricular regional wall motion abnormalities. Right Ventricle: The right ventricle has normal systolic function. The cavity was Borderline enlarged. Left Atrium: Left atrial size was moderately dilated. Right Atrium: Right atrial size was normal in size. Right atrial pressure is estimated at 10 mmHg. Interatrial Septum: Double contrast study not performed. A small patent foramen ovale is detected with predominantly left to right shunting across the atrial septum. Pericardium: There is no evidence of pericardial effusion. There is no pleural effusion. Mitral Valve: The mitral valve is myxomatous. Mild thickening of the mitral valve leaflet. Mitral valve regurgitation is moderate to severe by color flow Doppler. The MR jet is anteriorly-directed. Pulmonary venous flow shows systolic flow reversal. Tricuspid Valve: The tricuspid valve was normal in structure. Tricuspid valve regurgitation is trivial by color flow Doppler. Aortic Valve: The aortic valve is tricuspid Aortic valve regurgitation is trivial by color flow Doppler. Pulmonic Valve: The pulmonic valve was normal in structure. Pulmonic valve regurgitation is not visualized by color flow Doppler. Aorta: The aortic root, ascending aorta and aortic arch are normal in size and structure.  RIGHT ATRIUM RA Pressure: 10 mmHg   [name] MD Electronically signed by [name] MD Signature Date/Time: 09/05/2018/3:09:10 PM    RIGHT/LEFT HEART CATH AND CORONARY ANGIOGRAPHY  Conclusion   Normal coronary arteries with superdominant right coronary artery No coronary artery disease Normal filling pressures.  Recommendation: Follow up with CVTS re: management of severe mitral regrgitation  Elder Negus, MD [facility] [place] Pager: [phone] Office: [phone] If no answer Cell [phone]    Recommendations   Antiplatelet/Anticoag No indication for  antiplatelet therapy at this time .  Surgeon Notes     09/05/2018 11:19 AM CV Procedure signed by [name], MD  Indications   Severe mitral regurgitation [I34.0 (ICD-10-CM)]  Procedural Details   Technical Details Procedures: 1. Right heart catheterization 2. Left heart catheterization 3. Selective left and right coronary angiography 4. Conscious sedation monitoring 17 min  Indication: Severe mitral regurgitation  History: 58 y/o Caucasian male with severe mitral regurgitation here for pre-op evaluation.  Diagnostic Angiography: 5 Fr TIG 4.0  Pressures tracings obtained in right atrium, right ventricle, pulmonary artery, and pulmonary capillary wedge position.   Anticoagulation:  4500 units heparin  Hemostasis: TR band  Total  contrast used: 15 cc   Total fluoro time: 2.9 min Air Kerma: 90 mGy  All wires and catheters removed out of the body at the end of the procedure Final angiogram showed no dissection/perforation         Estimated blood loss <50 mL.   During this procedure medications were administered to achieve and maintain moderate conscious sedation while the patient's heart rate, blood pressure, and oxygen saturation were continuously monitored and I was present face-to-face 100% of this time.  Medications  (Filter: Administrations occurring from 09/05/18 1137 to 09/05/18 1248)          Medication Rate/Dose/Volume Action  Date Time   Heparin (Porcine) in NaCl 1000-0.9 UT/500ML-% SOLN (mL) 500 mL Given 09/05/18 1152   Total dose as of 11/13/18 1037 500 mL Given 1152   1,500 mL 500 mL Given 1159   0.9 % sodium chloride infusion (mL/hr) 10 mL/hr New Bag/Given 09/05/18 1152   Dosing weight:  93 kg        Total dose as of 11/13/18 1037        Cannot be calculated        fentaNYL (SUBLIMAZE) injection (mcg) 25 mcg Given 09/05/18 1213   Total dose as of 11/13/18 1037        25 mcg        midazolam (VERSED)  injection (mg) 1 mg Given 09/05/18 1213   Total dose as of 11/13/18 1037        1 mg        lidocaine (PF) (XYLOCAINE) 1 % injection (mL) 2 mL Given 09/05/18 1213   Total dose as of 11/13/18 1037        2 mL        heparin injection (Units) 4,500 Units Given 09/05/18 1224   Total dose as of 11/13/18 1037        4,500 Units        Radial Cocktail/Verapamil only (mL) 10 mL Given 09/05/18 1217   Total dose as of 11/13/18 1037        10 mL        iohexol (OMNIPAQUE) 350 MG/ML injection (mL) 15 mL Given 09/05/18 1236   Total dose as of 11/13/18 1037        15 mL        Sedation Time   Sedation Time Physician-1: 17 minutes 23 seconds  Complications   Complications documented before study signed (09/05/2018 12:48 PM EST)    RIGHT/LEFT HEART CATH AND CORONARY ANGIOGRAPHY   None Documented by Elder NegusPatwardhan, Manish J, MD 09/05/2018 12:36 PM EST  Time Range: Intraprocedure      Coronary Findings   Diagnostic  Dominance: Right  Left Main  Vessel is normal in caliber. Vessel is angiographically normal.  Left Anterior Descending  Vessel is normal in caliber. Vessel is angiographically normal.  Left Circumflex  Vessel is normal in caliber. Vessel is angiographically normal.  Right Coronary Artery  Vessel is very large. Vessel is angiographically normal.  Intervention   No interventions have been documented.  Right Heart   Right Heart Pressures RA: 5 mmHg RV: 34/3 mmHg PA: 34/11 mmHg, mean PA 20 mmHg PCWP: 9 mmHg  CO: 7.3 L/min CI: 3.4 L/min/m2  Left Heart   Left Ventricle LV end diastolic pressure is normal.  Coronary Diagrams   Diagnostic  Dominance: Right    Intervention   Implants       No implant documentation for this case.  Syngo Images   Show images for CARDIAC CATHETERIZATION  Images on Long Term Storage   Show images for Alvan DameShepherd, Stefano E Jr.   Link to Procedure Log    Procedure Log    Hemo Data    Most Recent Value  Fick Cardiac Output 7.34 L/min  Fick Cardiac Output Index 3.41 (L/min)/BSA  RA A Wave 9 mmHg  RA V Wave 6 mmHg  RA Mean 5 mmHg  RV Systolic Pressure 34 mmHg  RV Diastolic Pressure 3 mmHg  RV EDP 6 mmHg  PA Systolic Pressure 34 mmHg  PA Diastolic Pressure 11 mmHg  PA Mean 20 mmHg  PW A Wave 11 mmHg  PW V Wave 11 mmHg  PW Mean 9 mmHg  AO Systolic Pressure 114 mmHg  AO Diastolic Pressure 66 mmHg  AO Mean 88 mmHg  LV Systolic Pressure 107 mmHg  LV Diastolic Pressure 6 mmHg  LV EDP 9 mmHg  AOp Systolic Pressure 112 mmHg  AOp Diastolic Pressure 66 mmHg  AOp Mean Pressure 87 mmHg  LVp Systolic Pressure 112 mmHg  LVp Diastolic Pressure 5 mmHg  LVp EDP Pressure 10 mmHg  QP/QS 1  TPVR Index 5.86 HRUI  TSVR Index 25.81 HRUI  PVR SVR Ratio 0.13  TPVR/TSVR Ratio 0.23   CT ANGIOGRAPHY CHEST, ABDOMEN AND PELVIS  TECHNIQUE: Multidetector CT imaging through the chest, abdomen and pelvis was performed using the standard protocol during bolus administration of intravenous contrast. Multiplanar reconstructed images and MIPs were obtained and reviewed to evaluate the vascular anatomy.  CONTRAST: 75mL ISOVUE-370 IOPAMIDOL (ISOVUE-370) INJECTION 76%  COMPARISON: CT 08/09/2016, 01/15/2008  FINDINGS: CTA CHEST FINDINGS  Cardiovascular:  Heart:  No cardiomegaly. No pericardial fluid/thickening. No significant coronary calcifications.  Aorta:  Unremarkable course, caliber, contour of the thoracic aorta. No aneurysm or dissection flap. No periaortic fluid. Four vessel arch with separate origin of the left vertebral artery from the aortic arch.  Pulmonary arteries:  Suboptimal bolus timing for evaluation of the pulmonary arteries. Main pulmonary artery diameter unremarkable.  Mediastinum/Nodes: No mediastinal adenopathy. Unremarkable appearance of the thoracic esophagus.  Unremarkable thoracic  inlet  Lungs/Pleura: Central airways are clear. No pleural effusion. No confluent airspace disease.  No pneumothorax. Atelectasis at the lung bases.  Musculoskeletal: No acute displaced fracture. Degenerative changes of the spine.  Review of the MIP images confirms the above findings.  CTA ABDOMEN AND PELVIS FINDINGS  VASCULAR  Aorta: Unremarkable course, caliber, contour of the abdominal aorta. No dissection, aneurysm, or periaortic fluid.  Celiac: No significant atherosclerotic changes at the origin of the celiac artery. Typical branch pattern of the celiac artery, with left gastric, common hepatic, and splenic artery identified.  SMA: No significant atherosclerotic changes of the superior mesenteric artery.  Renals: Renal arteries are patent. Single left and single right renal artery.  IMA: Inferior mesenteric artery is patent. Left colic artery patent. Superior rectal artery patent.  Right lower extremity:  Unremarkable course, caliber, and contour of the right iliac system. No aneurysm, dissection, or occlusion. Hypogastric artery is patent. Anterior and posterior division patent. Common femoral artery patent. Proximal SFA and profunda femoris patent.  Left lower extremity:  Unremarkable course, caliber, and contour of the left iliac system. No aneurysm, dissection, or occlusion. Hypogastric artery is patent. Anterior and posterior division patent. Common femoral artery patent. Proximal SFA and profunda femoris patent.  Veins: Unremarkable appearance of the venous system.  Review of the MIP images confirms the above findings.  NON-VASCULAR  Hepatobiliary: Redemonstration of  small hemangioma of the right liver lobe (image 154 of series 5). Unremarkable gall bladder.  Pancreas: Unremarkable  Spleen: Unremarkable.  Adrenals/Urinary Tract: Unremarkable appearance of adrenal glands.  Right:  No hydronephrosis. Symmetric perfusion  to the left. Small nonobstructive nephrolithiasis, unchanged from the comparison study. Largest stone at the lower pole collecting system measures 3 mm. Unremarkable course of the right ureter.  Left:  No hydronephrosis. Symmetric perfusion to the right. Nonobstructive nephrolithiasis of the left kidney, unchanged from prior. Single stone in the interpolar collecting system measures 4 mm. Unremarkable course of the left ureter.  Unremarkable appearance of the urinary bladder .  Stomach/Bowel: Unremarkable appearance of the stomach. Unremarkable appearance of small bowel. No evidence of obstruction. Mild colonic diverticula without evidence of acute diverticulitis. Normal appendix.  Lymphatic: Small nodes of the mesentery without adenopathy.  Mesenteric: No free fluid or air. No adenopathy.  Reproductive: Diameter of the prostate measures 5.5 cm. Internal calcifications.  Other: No hernia.  Musculoskeletal: No evidence of acute fracture. No bony canal narrowing. No significant degenerative changes of the hips.  IMPRESSION: No acute finding.  No significant atherosclerotic changes.  Redemonstration of right liver hemangioma.  Bilateral nonobstructing nephrolithiasis.  Diverticular disease without evidence of acute diverticulitis.  Signed,  Yvone Neu. Reyne Dumas, RPVI  [facility]  [facility]   Electronically Signed By: Gilmer Mor D.O. On: 12/07/2018 12:09   Impression:  Patient has mitral valve prolapse with stage D severe symptomatic primary mitral regurgitation.   He presents with stable symptoms of exertional shortness of breath consistent with chronic diastolic congestive heart failure, New York Heart Association functional class I-II.  I have personally reviewed the patient's recent transesophageal echocardiogram and diagnostic cardiac catheterization. He has classical myxomatous  degenerative disease with severe billowing and bileaflet prolapse with particularly severe prolapse involving the middle scallop of the posterior leaflet. There are multiple elongated primary chordae tendinae. I do not appreciate any ruptured chordae tendinae.There is at least moderate and probably severe central mitral regurgitation. There is mild left ventricular chamber enlargement and moderate left atrial enlargement. No other significant abnormalities are noted. Diagnostic cardiac catheterization is notable for the absence of significant coronary artery disease. Right heart pressures were normal. I agree the patient would best be treated with elective mitral valve repair. Based upon review of the patient's transesophageal echocardiogram I feel there is a greater than 95% likelihood of successful and durable mitral valve repair with very low operative risk. The patient appears to be good candidate for minimally invasive approach for surgery.  CT angiography revealed no contraindications to peripheral cannulation for surgery.   Plan:   The patient was again counseled at length regarding the indications, risks and potential benefits of mitral valve repair. The rationale for elective surgery has been explained, including a comparison between surgery and continued medical therapy with close follow-up. The likelihood of successful and durable valve repair has been discussed with particular reference to the findings of their recent echocardiogram.  Based upon these findings and previous experience, I have quoted them a greater than 95percent likelihood of successful valve repair.  Alternative surgical approaches have been discussed including a comparison between conventional sternotomy and minimally-invasive techniques.Expectations for the patient's postoperative convalescence have been discussed.  We also discussed the fact that the patient lives alone and does not have any immediate family  who lives nearby.  It is unclear whether or not he will have 24 hours a day support and he may need  temporary placement in a rehab facility for his early convalescence.  The patient understands and accepts all potential risks of surgery including but not limited to risk of death, stroke or other neurologic complication, myocardial infarction, congestive heart failure, respiratory failure, renal failure, bleeding requiring transfusion and/or reexploration, arrhythmia, infection or other wound complications, pneumonia, pleural and/or pericardial effusion, pulmonary embolus, aortic dissection or other major vascular complication, or delayed complications related to valve repair or replacement including but not limited to structural valve deterioration and failure, thrombosis, embolization, endocarditis, or paravalvular leak.  Specific risks potentially related to the minimally-invasive approach were discussed at length, including but not limited to risk of conversion to full or partial sternotomy, aortic dissection or other major vascular complication, unilateral acute lung injury or pulmonary edema, phrenic nerve dysfunction or paralysis, rib fracture, chronic pain, lung hernia, or lymphocele. All of his questions have been answered.     Valentina Gu. Roxy Manns, MD 12/18/2018 11:05 AM

## 2018-12-26 ENCOUNTER — Inpatient Hospital Stay (HOSPITAL_COMMUNITY): Payer: Federal, State, Local not specified - PPO | Admitting: Vascular Surgery

## 2018-12-26 ENCOUNTER — Inpatient Hospital Stay (HOSPITAL_COMMUNITY): Payer: Federal, State, Local not specified - PPO | Admitting: Anesthesiology

## 2018-12-26 ENCOUNTER — Encounter (HOSPITAL_COMMUNITY): Payer: Self-pay

## 2018-12-26 ENCOUNTER — Inpatient Hospital Stay (HOSPITAL_COMMUNITY): Payer: Federal, State, Local not specified - PPO

## 2018-12-26 ENCOUNTER — Encounter (HOSPITAL_COMMUNITY)
Admission: RE | Disposition: A | Discharge status: Home / Self Care | Payer: Self-pay | Source: Home / Self Care | Attending: Thoracic Surgery (Cardiothoracic Vascular Surgery)

## 2018-12-26 ENCOUNTER — Inpatient Hospital Stay (HOSPITAL_COMMUNITY)
Admission: RE | Admit: 2018-12-26 | Discharge: 2018-12-31 | DRG: 221 | Disposition: A | Discharge status: Home / Self Care | Payer: Federal, State, Local not specified - PPO | Attending: Thoracic Surgery (Cardiothoracic Vascular Surgery) | Admitting: Thoracic Surgery (Cardiothoracic Vascular Surgery)

## 2018-12-26 ENCOUNTER — Other Ambulatory Visit: Payer: Self-pay

## 2018-12-26 DIAGNOSIS — Z9889 Other specified postprocedural states: Secondary | ICD-10-CM

## 2018-12-26 DIAGNOSIS — J939 Pneumothorax, unspecified: Secondary | ICD-10-CM | POA: Diagnosis not present

## 2018-12-26 DIAGNOSIS — I340 Nonrheumatic mitral (valve) insufficiency: Secondary | ICD-10-CM | POA: Diagnosis present

## 2018-12-26 DIAGNOSIS — Z4682 Encounter for fitting and adjustment of non-vascular catheter: Secondary | ICD-10-CM | POA: Diagnosis not present

## 2018-12-26 DIAGNOSIS — Z8249 Family history of ischemic heart disease and other diseases of the circulatory system: Secondary | ICD-10-CM

## 2018-12-26 DIAGNOSIS — I341 Nonrheumatic mitral (valve) prolapse: Secondary | ICD-10-CM | POA: Diagnosis not present

## 2018-12-26 DIAGNOSIS — I4891 Unspecified atrial fibrillation: Secondary | ICD-10-CM | POA: Diagnosis present

## 2018-12-26 DIAGNOSIS — J9383 Other pneumothorax: Secondary | ICD-10-CM | POA: Diagnosis not present

## 2018-12-26 DIAGNOSIS — R0609 Other forms of dyspnea: Secondary | ICD-10-CM | POA: Diagnosis present

## 2018-12-26 DIAGNOSIS — H409 Unspecified glaucoma: Secondary | ICD-10-CM | POA: Diagnosis not present

## 2018-12-26 DIAGNOSIS — J9811 Atelectasis: Secondary | ICD-10-CM

## 2018-12-26 DIAGNOSIS — I10 Essential (primary) hypertension: Secondary | ICD-10-CM | POA: Diagnosis present

## 2018-12-26 DIAGNOSIS — J90 Pleural effusion, not elsewhere classified: Secondary | ICD-10-CM | POA: Diagnosis not present

## 2018-12-26 DIAGNOSIS — R0600 Dyspnea, unspecified: Secondary | ICD-10-CM | POA: Diagnosis present

## 2018-12-26 HISTORY — DX: Other specified postprocedural states: Z98.890

## 2018-12-26 HISTORY — PX: TEE WITHOUT CARDIOVERSION: SHX5443

## 2018-12-26 HISTORY — PX: MITRAL VALVE REPAIR: SHX2039

## 2018-12-26 LAB — GLUCOSE, CAPILLARY
Glucose-Capillary: 88 mg/dL (ref 70–99)
Glucose-Capillary: 90 mg/dL (ref 70–99)
Glucose-Capillary: 104 mg/dL — ABNORMAL HIGH (ref 70–99)
Glucose-Capillary: 96 mg/dL (ref 70–99)
Glucose-Capillary: 141 mg/dL — ABNORMAL HIGH (ref 70–99)

## 2018-12-26 LAB — PLATELET COUNT: Platelets: 201 10*3/uL (ref 150–400)

## 2018-12-26 LAB — POCT I-STAT 7, (LYTES, BLD GAS, ICA,H+H)
Acid-base deficit: 1 mmol/L (ref 0.0–2.0)
Bicarbonate: 26.4 mmol/L (ref 20.0–28.0)
Bicarbonate: 24.7 mmol/L (ref 20.0–28.0)
Calcium, Ion: 1.03 mmol/L — ABNORMAL LOW (ref 1.15–1.40)
Calcium, Ion: 1.1 mmol/L — ABNORMAL LOW (ref 1.15–1.40)
HCT: 32 % — ABNORMAL LOW (ref 39.0–52.0)
HCT: 31 % — ABNORMAL LOW (ref 39.0–52.0)
Hemoglobin: 10.9 g/dL — ABNORMAL LOW (ref 13.0–17.0)
Hemoglobin: 10.5 g/dL — ABNORMAL LOW (ref 13.0–17.0)
O2 Saturation: 100 %
O2 Saturation: 100 %
Potassium: 4.1 mmol/L (ref 3.5–5.1)
Potassium: 4 mmol/L (ref 3.5–5.1)
Sodium: 139 mmol/L (ref 135–145)
Sodium: 139 mmol/L (ref 135–145)
TCO2: 28 mmol/L (ref 22–32)
TCO2: 26 mmol/L (ref 22–32)
pCO2 arterial: 49.4 mmHg — ABNORMAL HIGH (ref 32.0–48.0)
pCO2 arterial: 44 mmHg (ref 32.0–48.0)
pH, Arterial: 7.336 — ABNORMAL LOW (ref 7.350–7.450)
pH, Arterial: 7.356 (ref 7.350–7.450)
pO2, Arterial: 421 mmHg — ABNORMAL HIGH (ref 83.0–108.0)
pO2, Arterial: 224 mmHg — ABNORMAL HIGH (ref 83.0–108.0)

## 2018-12-26 LAB — POCT I-STAT 4, (NA,K, GLUC, HGB,HCT)
Glucose, Bld: 91 mg/dL (ref 70–99)
Glucose, Bld: 121 mg/dL — ABNORMAL HIGH (ref 70–99)
Glucose, Bld: 131 mg/dL — ABNORMAL HIGH (ref 70–99)
Glucose, Bld: 161 mg/dL — ABNORMAL HIGH (ref 70–99)
Glucose, Bld: 144 mg/dL — ABNORMAL HIGH (ref 70–99)
Glucose, Bld: 166 mg/dL — ABNORMAL HIGH (ref 70–99)
HCT: 37 % — ABNORMAL LOW (ref 39.0–52.0)
HCT: 32 % — ABNORMAL LOW (ref 39.0–52.0)
HCT: 35 % — ABNORMAL LOW (ref 39.0–52.0)
HCT: 29 % — ABNORMAL LOW (ref 39.0–52.0)
HCT: 29 % — ABNORMAL LOW (ref 39.0–52.0)
HCT: 31 % — ABNORMAL LOW (ref 39.0–52.0)
Hemoglobin: 12.6 g/dL — ABNORMAL LOW (ref 13.0–17.0)
Hemoglobin: 10.9 g/dL — ABNORMAL LOW (ref 13.0–17.0)
Hemoglobin: 11.9 g/dL — ABNORMAL LOW (ref 13.0–17.0)
Hemoglobin: 9.9 g/dL — ABNORMAL LOW (ref 13.0–17.0)
Hemoglobin: 10.5 g/dL — ABNORMAL LOW (ref 13.0–17.0)
Hemoglobin: 9.9 g/dL — ABNORMAL LOW (ref 13.0–17.0)
Potassium: 3.7 mmol/L (ref 3.5–5.1)
Potassium: 3.9 mmol/L (ref 3.5–5.1)
Potassium: 4 mmol/L (ref 3.5–5.1)
Potassium: 4.3 mmol/L (ref 3.5–5.1)
Potassium: 3.8 mmol/L (ref 3.5–5.1)
Potassium: 5.1 mmol/L (ref 3.5–5.1)
Sodium: 139 mmol/L (ref 135–145)
Sodium: 139 mmol/L (ref 135–145)
Sodium: 139 mmol/L (ref 135–145)
Sodium: 137 mmol/L (ref 135–145)
Sodium: 137 mmol/L (ref 135–145)
Sodium: 140 mmol/L (ref 135–145)

## 2018-12-26 LAB — CBC
HCT: 36.7 % — ABNORMAL LOW (ref 39.0–52.0)
HCT: 36.6 % — ABNORMAL LOW (ref 39.0–52.0)
Hemoglobin: 12.5 g/dL — ABNORMAL LOW (ref 13.0–17.0)
Hemoglobin: 12.7 g/dL — ABNORMAL LOW (ref 13.0–17.0)
MCH: 31.6 pg (ref 26.0–34.0)
MCH: 32.2 pg (ref 26.0–34.0)
MCHC: 34.1 g/dL (ref 30.0–36.0)
MCHC: 34.7 g/dL (ref 30.0–36.0)
MCV: 92.9 fL (ref 80.0–100.0)
MCV: 92.7 fL (ref 80.0–100.0)
Platelets: 178 10*3/uL (ref 150–400)
Platelets: 171 10*3/uL (ref 150–400)
RBC: 3.95 MIL/uL — ABNORMAL LOW (ref 4.22–5.81)
RBC: 3.95 MIL/uL — ABNORMAL LOW (ref 4.22–5.81)
RDW: 11.9 % (ref 11.5–15.5)
RDW: 12.1 % (ref 11.5–15.5)
WBC: 14 10*3/uL — ABNORMAL HIGH (ref 4.0–10.5)
WBC: 9.5 10*3/uL (ref 4.0–10.5)
nRBC: 0 % (ref 0.0–0.2)
nRBC: 0 % (ref 0.0–0.2)

## 2018-12-26 LAB — CREATININE, SERUM
Creatinine, Ser: 0.87 mg/dL (ref 0.61–1.24)
GFR calc Af Amer: >60 mL/min (ref >60)
GFR calc non Af Amer: >60 mL/min (ref >60)

## 2018-12-26 LAB — POCT I-STAT, CHEM 8
BUN: 17 mg/dL (ref 6–20)
Calcium, Ion: 1.12 mmol/L — ABNORMAL LOW (ref 1.15–1.40)
Chloride: 104 mmol/L (ref 98–111)
Creatinine, Ser: 0.6 mg/dL — ABNORMAL LOW (ref 0.61–1.24)
Glucose, Bld: 147 mg/dL — ABNORMAL HIGH (ref 70–99)
HCT: 36 % — ABNORMAL LOW (ref 39.0–52.0)
Hemoglobin: 12.2 g/dL — ABNORMAL LOW (ref 13.0–17.0)
Potassium: 4.6 mmol/L (ref 3.5–5.1)
Sodium: 136 mmol/L (ref 135–145)
TCO2: 24 mmol/L (ref 22–32)

## 2018-12-26 LAB — HEMOGLOBIN AND HEMATOCRIT, BLOOD
HCT: 30.3 % — ABNORMAL LOW (ref 39.0–52.0)
Hemoglobin: 10.5 g/dL — ABNORMAL LOW (ref 13.0–17.0)

## 2018-12-26 LAB — APTT: aPTT: 29 seconds (ref 24–36)

## 2018-12-26 LAB — PROTIME-INR
INR: 1.4 — ABNORMAL HIGH (ref 0.8–1.2)
Prothrombin Time: 16.9 seconds — ABNORMAL HIGH (ref 11.4–15.2)

## 2018-12-26 LAB — ECHO INTRAOPERATIVE TEE
Height: 72 in
Weight: 3120 oz

## 2018-12-26 LAB — MAGNESIUM: Magnesium: 3.5 mg/dL — ABNORMAL HIGH (ref 1.7–2.4)

## 2018-12-26 SURGERY — REPAIR, MITRAL VALVE, MINIMALLY INVASIVE
Anesthesia: General | Site: Chest | Laterality: Right

## 2018-12-26 MED ORDER — SUGAMMADEX SODIUM 200 MG/2ML IV SOLN
INTRAVENOUS | Status: DC | PRN
  Administered 2018-12-26: 200 mg via INTRAVENOUS

## 2018-12-26 MED ORDER — ROCURONIUM BROMIDE 10 MG/ML (PF) SYRINGE
PREFILLED_SYRINGE | INTRAVENOUS | Status: DC | PRN
  Administered 2018-12-26: 70 mg via INTRAVENOUS
  Administered 2018-12-26: 30 mg via INTRAVENOUS
  Administered 2018-12-26: 50 mg via INTRAVENOUS
  Administered 2018-12-26: 20 mg via INTRAVENOUS

## 2018-12-26 MED ORDER — TIMOLOL MALEATE 0.5 % OP SOLN
1.0000 [drp] | Freq: Every day | OPHTHALMIC | Status: DC
  Administered 2018-12-29 – 2018-12-30 (×2): 1 [drp] via OPHTHALMIC
  Filled 2018-12-26: qty 5

## 2018-12-26 MED ORDER — PROTAMINE SULFATE 10 MG/ML IV SOLN
INTRAVENOUS | Status: AC
  Filled 2018-12-26: qty 5

## 2018-12-26 MED ORDER — BUPIVACAINE HCL (PF) 0.5 % IJ SOLN
INTRAMUSCULAR | Status: DC | PRN
  Administered 2018-12-26: 10 mL

## 2018-12-26 MED ORDER — SODIUM CHLORIDE 0.9% FLUSH
10.0000 mL | Freq: Two times a day (BID) | INTRAVENOUS | Status: DC
  Administered 2018-12-26 – 2018-12-31 (×7): 10 mL

## 2018-12-26 MED ORDER — BUPIVACAINE 0.5 % ON-Q PUMP SINGLE CATH 400 ML
400.0000 mL | INJECTION | Status: DC
  Filled 2018-12-26: qty 400

## 2018-12-26 MED ORDER — ACETAMINOPHEN 160 MG/5ML PO SOLN: 1000.0000 mg | Freq: Four times a day (QID) | ORAL | Status: DC

## 2018-12-26 MED ORDER — PANTOPRAZOLE SODIUM 40 MG PO TBEC
40.0000 mg | DELAYED_RELEASE_TABLET | Freq: Every day | ORAL | Status: DC
  Administered 2018-12-27 – 2018-12-31 (×5): 40 mg via ORAL
  Filled 2018-12-26 (×5): qty 1

## 2018-12-26 MED ORDER — PHENYLEPHRINE HCL (PRESSORS) 10 MG/ML IV SOLN
INTRAVENOUS | Status: AC
  Filled 2018-12-26: qty 2

## 2018-12-26 MED ORDER — INSULIN REGULAR BOLUS VIA INFUSION
0.0000 [IU] | Freq: Three times a day (TID) | INTRAVENOUS | Status: DC
  Filled 2018-12-26: qty 10

## 2018-12-26 MED ORDER — SODIUM CHLORIDE 0.45 % IV SOLN
INTRAVENOUS | Status: DC | PRN
  Administered 2018-12-26: 18:00:00 via INTRAVENOUS

## 2018-12-26 MED ORDER — PROTAMINE SULFATE 10 MG/ML IV SOLN
INTRAVENOUS | Status: DC | PRN
  Administered 2018-12-26: 20 mg via INTRAVENOUS
  Administered 2018-12-26: 290 mg via INTRAVENOUS

## 2018-12-26 MED ORDER — GLUTARALDEHYDE 0.625% SOAKING SOLUTION
TOPICAL | Status: DC | PRN
  Filled 2018-12-26: qty 50

## 2018-12-26 MED ORDER — ONDANSETRON HCL 4 MG/2ML IJ SOLN
INTRAMUSCULAR | Status: DC | PRN
  Administered 2018-12-26: 4 mg via INTRAVENOUS

## 2018-12-26 MED ORDER — PHENYLEPHRINE HCL-NACL 20-0.9 MG/250ML-% IV SOLN: 0.0000 ug/min | INTRAVENOUS | Status: DC

## 2018-12-26 MED ORDER — LIDOCAINE 2% (20 MG/ML) 5 ML SYRINGE
INTRAMUSCULAR | Status: AC
  Filled 2018-12-26: qty 5

## 2018-12-26 MED ORDER — FENTANYL CITRATE (PF) 250 MCG/5ML IJ SOLN
INTRAMUSCULAR | Status: DC | PRN
  Administered 2018-12-26: 250 ug via INTRAVENOUS
  Administered 2018-12-26 (×2): 50 ug via INTRAVENOUS
  Administered 2018-12-26: 400 ug via INTRAVENOUS

## 2018-12-26 MED ORDER — SODIUM CHLORIDE 0.9 % IV SOLN
INTRAVENOUS | Status: DC | PRN
  Administered 2018-12-26: 750 mg via INTRAVENOUS

## 2018-12-26 MED ORDER — CHLORHEXIDINE GLUCONATE 0.12 % MT SOLN
15.0000 mL | OROMUCOSAL | Status: AC
  Administered 2018-12-26: 15 mL via OROMUCOSAL
  Filled 2018-12-26: qty 15

## 2018-12-26 MED ORDER — SODIUM CHLORIDE 0.9% FLUSH
3.0000 mL | Freq: Two times a day (BID) | INTRAVENOUS | Status: DC
  Administered 2018-12-27 (×2): 3 mL via INTRAVENOUS

## 2018-12-26 MED ORDER — ONDANSETRON HCL 4 MG/2ML IJ SOLN
4.0000 mg | Freq: Four times a day (QID) | INTRAMUSCULAR | Status: DC | PRN
  Administered 2018-12-26 – 2018-12-29 (×3): 4 mg via INTRAVENOUS
  Filled 2018-12-26 (×3): qty 2

## 2018-12-26 MED ORDER — BUPIVACAINE HCL (PF) 0.5 % IJ SOLN
INTRAMUSCULAR | Status: AC
  Filled 2018-12-26: qty 10

## 2018-12-26 MED ORDER — ASPIRIN 81 MG PO CHEW: 324.0000 mg | CHEWABLE_TABLET | Freq: Every day | ORAL | Status: DC

## 2018-12-26 MED ORDER — BUPIVACAINE HCL (PF) 0.5 % IJ SOLN: INTRAMUSCULAR | Status: DC | PRN

## 2018-12-26 MED ORDER — BISACODYL 5 MG PO TBEC
10.0000 mg | DELAYED_RELEASE_TABLET | Freq: Every day | ORAL | Status: DC
  Administered 2018-12-27 – 2018-12-31 (×3): 10 mg via ORAL
  Filled 2018-12-26 (×4): qty 2

## 2018-12-26 MED ORDER — MIDAZOLAM HCL (PF) 10 MG/2ML IJ SOLN
INTRAMUSCULAR | Status: AC
  Filled 2018-12-26: qty 2

## 2018-12-26 MED ORDER — LACTATED RINGERS IV SOLN: INTRAVENOUS | Status: DC

## 2018-12-26 MED ORDER — CHLORHEXIDINE GLUCONATE 0.12 % MT SOLN
15.0000 mL | Freq: Two times a day (BID) | OROMUCOSAL | Status: DC
  Administered 2018-12-26 – 2018-12-27 (×2): 15 mL via OROMUCOSAL
  Filled 2018-12-26: qty 15

## 2018-12-26 MED ORDER — ALBUMIN HUMAN 5 % IV SOLN: 250.0000 mL | INTRAVENOUS | Status: AC | PRN

## 2018-12-26 MED ORDER — FENTANYL CITRATE (PF) 250 MCG/5ML IJ SOLN
INTRAMUSCULAR | Status: AC
  Filled 2018-12-26: qty 10

## 2018-12-26 MED ORDER — POTASSIUM CHLORIDE 10 MEQ/50ML IV SOLN: 10.0000 meq | INTRAVENOUS | Status: AC

## 2018-12-26 MED ORDER — ACETAMINOPHEN 500 MG PO TABS
1000.0000 mg | ORAL_TABLET | Freq: Four times a day (QID) | ORAL | Status: DC
  Administered 2018-12-26 – 2018-12-31 (×15): 1000 mg via ORAL
  Filled 2018-12-26 (×15): qty 2

## 2018-12-26 MED ORDER — SODIUM CHLORIDE 0.9 % IV SOLN: INTRAVENOUS | Status: DC | PRN

## 2018-12-26 MED ORDER — SODIUM CHLORIDE 0.9% FLUSH: 10.0000 mL | INTRAVENOUS | Status: DC | PRN

## 2018-12-26 MED ORDER — PROTAMINE SULFATE 10 MG/ML IV SOLN
INTRAVENOUS | Status: AC
  Filled 2018-12-26: qty 25

## 2018-12-26 MED ORDER — DOCUSATE SODIUM 100 MG PO CAPS
200.0000 mg | ORAL_CAPSULE | Freq: Every day | ORAL | Status: DC
  Administered 2018-12-27 – 2018-12-31 (×4): 200 mg via ORAL
  Filled 2018-12-26 (×4): qty 2

## 2018-12-26 MED ORDER — DEXAMETHASONE SODIUM PHOSPHATE 10 MG/ML IJ SOLN
INTRAMUSCULAR | Status: DC | PRN
  Administered 2018-12-26: 10 mg via INTRAVENOUS

## 2018-12-26 MED ORDER — MORPHINE SULFATE (PF) 2 MG/ML IV SOLN
1.0000 mg | INTRAVENOUS | Status: DC | PRN
  Administered 2018-12-26: 2 mg via INTRAVENOUS
  Filled 2018-12-26: qty 1

## 2018-12-26 MED ORDER — LACTATED RINGERS IV SOLN
INTRAVENOUS | Status: DC
  Administered 2018-12-27: 01:00:00 via INTRAVENOUS

## 2018-12-26 MED ORDER — SODIUM CHLORIDE 0.9 % IV SOLN: 250.0000 mL | INTRAVENOUS | Status: DC

## 2018-12-26 MED ORDER — ORAL CARE MOUTH RINSE
15.0000 mL | Freq: Two times a day (BID) | OROMUCOSAL | Status: DC
  Administered 2018-12-27: 15 mL via OROMUCOSAL

## 2018-12-26 MED ORDER — MIDAZOLAM HCL 5 MG/5ML IJ SOLN
INTRAMUSCULAR | Status: DC | PRN
  Administered 2018-12-26 (×4): 1 mg via INTRAVENOUS

## 2018-12-26 MED ORDER — ACETAMINOPHEN 650 MG RE SUPP
650.0000 mg | Freq: Once | RECTAL | Status: AC
  Administered 2018-12-26: 650 mg via RECTAL

## 2018-12-26 MED ORDER — SODIUM CHLORIDE 0.9% FLUSH: 3.0000 mL | INTRAVENOUS | Status: DC | PRN

## 2018-12-26 MED ORDER — FENTANYL CITRATE (PF) 250 MCG/5ML IJ SOLN
INTRAMUSCULAR | Status: AC
  Filled 2018-12-26: qty 5

## 2018-12-26 MED ORDER — VANCOMYCIN HCL IN DEXTROSE 1-5 GM/200ML-% IV SOLN
1000.0000 mg | Freq: Once | INTRAVENOUS | Status: AC
  Administered 2018-12-26: 1000 mg via INTRAVENOUS
  Filled 2018-12-26: qty 200

## 2018-12-26 MED ORDER — LACTATED RINGERS IV SOLN
INTRAVENOUS | Status: DC | PRN
  Administered 2018-12-26: 07:00:00 via INTRAVENOUS

## 2018-12-26 MED ORDER — CHLORHEXIDINE GLUCONATE 0.12 % MT SOLN
15.0000 mL | Freq: Once | OROMUCOSAL | Status: AC
  Administered 2018-12-26: 15 mL via OROMUCOSAL
  Filled 2018-12-26: qty 15

## 2018-12-26 MED ORDER — INSULIN ASPART 100 UNIT/ML ~~LOC~~ SOLN
0.0000 [IU] | SUBCUTANEOUS | Status: DC
  Administered 2018-12-26 – 2018-12-27 (×4): 2 [IU] via SUBCUTANEOUS

## 2018-12-26 MED ORDER — PROPOFOL 10 MG/ML IV BOLUS
INTRAVENOUS | Status: DC | PRN
  Administered 2018-12-26: 30 mg via INTRAVENOUS

## 2018-12-26 MED ORDER — LATANOPROST 0.005 % OP SOLN
1.0000 [drp] | Freq: Every day | OPHTHALMIC | Status: DC
  Administered 2018-12-26 – 2018-12-30 (×5): 1 [drp] via OPHTHALMIC
  Filled 2018-12-26: qty 2.5

## 2018-12-26 MED ORDER — ALBUMIN HUMAN 5 % IV SOLN
INTRAVENOUS | Status: DC | PRN
  Administered 2018-12-26: 12:00:00 via INTRAVENOUS

## 2018-12-26 MED ORDER — FAMOTIDINE IN NACL 20-0.9 MG/50ML-% IV SOLN
20.0000 mg | Freq: Two times a day (BID) | INTRAVENOUS | Status: AC
  Administered 2018-12-26: 20 mg via INTRAVENOUS

## 2018-12-26 MED ORDER — MAGNESIUM SULFATE 4 GM/100ML IV SOLN
4.0000 g | Freq: Once | INTRAVENOUS | Status: AC
  Administered 2018-12-26: 4 g via INTRAVENOUS
  Filled 2018-12-26: qty 100

## 2018-12-26 MED ORDER — SODIUM CHLORIDE 0.9 % IV SOLN
1.5000 g | Freq: Two times a day (BID) | INTRAVENOUS | Status: AC
  Administered 2018-12-26 – 2018-12-28 (×4): 1.5 g via INTRAVENOUS
  Filled 2018-12-26 (×4): qty 1.5

## 2018-12-26 MED ORDER — ROCURONIUM BROMIDE 10 MG/ML (PF) SYRINGE
PREFILLED_SYRINGE | INTRAVENOUS | Status: AC
  Filled 2018-12-26: qty 20

## 2018-12-26 MED ORDER — INSULIN REGULAR(HUMAN) IN NACL 100-0.9 UT/100ML-% IV SOLN: INTRAVENOUS | Status: DC

## 2018-12-26 MED ORDER — PROPOFOL 10 MG/ML IV BOLUS
INTRAVENOUS | Status: AC
  Filled 2018-12-26: qty 40

## 2018-12-26 MED ORDER — HEPARIN SODIUM (PORCINE) 1000 UNIT/ML IJ SOLN
INTRAMUSCULAR | Status: DC | PRN
  Administered 2018-12-26: 31000 [IU] via INTRAVENOUS

## 2018-12-26 MED ORDER — CHLORHEXIDINE GLUCONATE 4 % EX LIQD: 30.0000 mL | CUTANEOUS | Status: DC

## 2018-12-26 MED ORDER — OXYCODONE HCL 5 MG PO TABS: 5.0000 mg | ORAL_TABLET | ORAL | Status: DC | PRN

## 2018-12-26 MED ORDER — 0.9 % SODIUM CHLORIDE (POUR BTL) OPTIME
TOPICAL | Status: DC | PRN
  Administered 2018-12-26: 5000 mL

## 2018-12-26 MED ORDER — TRAMADOL HCL 50 MG PO TABS
50.0000 mg | ORAL_TABLET | ORAL | Status: DC | PRN
  Administered 2018-12-28: 50 mg via ORAL
  Filled 2018-12-26: qty 1

## 2018-12-26 MED ORDER — SODIUM CHLORIDE 0.9 % IV SOLN: INTRAVENOUS | Status: AC

## 2018-12-26 MED ORDER — BUPIVACAINE LIPOSOME 1.3 % IJ SUSP
20.0000 mL | INTRAMUSCULAR | Status: DC
  Filled 2018-12-26: qty 20

## 2018-12-26 MED ORDER — CHLORHEXIDINE GLUCONATE CLOTH 2 % EX PADS
6.0000 | MEDICATED_PAD | Freq: Every day | CUTANEOUS | Status: DC
  Administered 2018-12-27 – 2018-12-30 (×3): 6 via TOPICAL

## 2018-12-26 MED ORDER — DEXMEDETOMIDINE HCL IN NACL 200 MCG/50ML IV SOLN: 0.0000 ug/kg/h | INTRAVENOUS | Status: DC

## 2018-12-26 MED ORDER — SODIUM CHLORIDE 0.9 % IV SOLN: INTRAVENOUS | Status: DC

## 2018-12-26 MED ORDER — LACTATED RINGERS IV SOLN: 500.0000 mL | Freq: Once | INTRAVENOUS | Status: DC | PRN

## 2018-12-26 MED ORDER — BUPIVACAINE HCL (PF) 0.5 % IJ SOLN
INTRAMUSCULAR | Status: AC
  Filled 2018-12-26: qty 30

## 2018-12-26 MED ORDER — METOPROLOL TARTRATE 5 MG/5ML IV SOLN
2.5000 mg | INTRAVENOUS | Status: DC | PRN
  Administered 2018-12-26: 2.5 mg via INTRAVENOUS
  Filled 2018-12-26: qty 5

## 2018-12-26 MED ORDER — MIDAZOLAM HCL 2 MG/2ML IJ SOLN: 2.0000 mg | INTRAMUSCULAR | Status: DC | PRN

## 2018-12-26 MED ORDER — NITROGLYCERIN IN D5W 200-5 MCG/ML-% IV SOLN: 0.0000 ug/min | INTRAVENOUS | Status: DC

## 2018-12-26 MED ORDER — ACETAMINOPHEN 160 MG/5ML PO SOLN: 650.0000 mg | Freq: Once | ORAL | Status: AC

## 2018-12-26 MED ORDER — HEPARIN SODIUM (PORCINE) 1000 UNIT/ML IJ SOLN
INTRAMUSCULAR | Status: AC
  Filled 2018-12-26: qty 1

## 2018-12-26 MED ORDER — ASPIRIN EC 325 MG PO TBEC
325.0000 mg | DELAYED_RELEASE_TABLET | Freq: Every day | ORAL | Status: DC
  Administered 2018-12-27: 325 mg via ORAL
  Filled 2018-12-26: qty 1

## 2018-12-26 MED ORDER — BISACODYL 10 MG RE SUPP: 10.0000 mg | Freq: Every day | RECTAL | Status: DC

## 2018-12-26 MED ORDER — METOPROLOL TARTRATE 12.5 MG HALF TABLET
12.5000 mg | ORAL_TABLET | Freq: Once | ORAL | Status: AC
  Administered 2018-12-26: 12.5 mg via ORAL
  Filled 2018-12-26: qty 1

## 2018-12-26 SURGICAL SUPPLY — 111 items
ADAPTER CARDIO PERF ANTE/RETRO (ADAPTER) ×3 IMPLANT
BAG DECANTER FOR FLEXI CONT (MISCELLANEOUS) ×6 IMPLANT
BLADE SURG 11 STRL SS (BLADE) ×3 IMPLANT
BLADE SURG ROTATE 9660 (MISCELLANEOUS) ×1 IMPLANT
CANISTER SUCT 3000ML PPV (MISCELLANEOUS) ×6 IMPLANT
CANNULA FEM VENOUS REMOTE 22FR (CANNULA) ×1 IMPLANT
CANNULA FEMORAL ART 14 SM (MISCELLANEOUS) ×3 IMPLANT
CANNULA GUNDRY RCSP 15FR (MISCELLANEOUS) ×3 IMPLANT
CANNULA OPTISITE PERFUSION 16F (CANNULA) IMPLANT
CANNULA OPTISITE PERFUSION 18F (CANNULA) ×2 IMPLANT
CANNULA SUMP PERICARDIAL (CANNULA) ×6 IMPLANT
CATH CPB KIT OWEN (MISCELLANEOUS) IMPLANT
CATH KIT ON Q 5IN SLV (PAIN MANAGEMENT) IMPLANT
CATH KIT ON-Q SILVERSOAK 5 (CATHETERS) IMPLANT
CATH KIT ON-Q SILVERSOAK 5IN (CATHETERS) ×3 IMPLANT
CELLS DAT CNTRL 66122 CELL SVR (MISCELLANEOUS) ×2 IMPLANT
CONN ST 1/4X3/8  BEN (MISCELLANEOUS) ×2
CONN ST 1/4X3/8 BEN (MISCELLANEOUS) ×4 IMPLANT
CONNECTOR 1/2X3/8X1/2 3 WAY (MISCELLANEOUS) ×1
CONNECTOR 1/2X3/8X1/2 3WAY (MISCELLANEOUS) ×2 IMPLANT
CONT SPEC 4OZ CLIKSEAL STRL BL (MISCELLANEOUS) ×3 IMPLANT
COVER BACK TABLE 24X17X13 BIG (DRAPES) ×3 IMPLANT
COVER PROBE W GEL 5X96 (DRAPES) ×1 IMPLANT
COVER WAND RF STERILE (DRAPES) ×2 IMPLANT
CRADLE DONUT ADULT HEAD (MISCELLANEOUS) ×3 IMPLANT
DERMABOND ADHESIVE PROPEN (GAUZE/BANDAGES/DRESSINGS) ×1
DERMABOND ADVANCED (GAUZE/BANDAGES/DRESSINGS) ×1
DERMABOND ADVANCED .7 DNX12 (GAUZE/BANDAGES/DRESSINGS) ×4 IMPLANT
DERMABOND ADVANCED .7 DNX6 (GAUZE/BANDAGES/DRESSINGS) IMPLANT
DEVICE CLOSURE PERCLS PRGLD 6F (VASCULAR PRODUCTS) IMPLANT
DEVICE PMI PUNCTURE CLOSURE (MISCELLANEOUS) ×3 IMPLANT
DEVICE SUT CK QUICK LOAD MINI (Prosthesis & Implant Heart) ×2 IMPLANT
DEVICE TROCAR PUNCTURE CLOSURE (ENDOMECHANICALS) ×3 IMPLANT
DRAIN CHANNEL 28F RND 3/8 FF (WOUND CARE) ×6 IMPLANT
DRAPE BILATERAL SPLIT (DRAPES) ×3 IMPLANT
DRAPE C-ARM 42X72 X-RAY (DRAPES) ×3 IMPLANT
DRAPE CV SPLIT W-CLR ANES SCRN (DRAPES) ×3 IMPLANT
DRAPE INCISE IOBAN 66X45 STRL (DRAPES) ×9 IMPLANT
DRAPE SLUSH/WARMER DISC (DRAPES) ×3 IMPLANT
DRSG AQUACEL AG ADV 3.5X10 (GAUZE/BANDAGES/DRESSINGS) ×1 IMPLANT
DRSG COVADERM 4X8 (GAUZE/BANDAGES/DRESSINGS) ×3 IMPLANT
ELECT BLADE 6.5 EXT (BLADE) ×3 IMPLANT
ELECT REM PT RETURN 9FT ADLT (ELECTROSURGICAL) ×6
ELECTRODE REM PT RTRN 9FT ADLT (ELECTROSURGICAL) ×4 IMPLANT
FELT TEFLON 1X6 (MISCELLANEOUS) ×6 IMPLANT
FEMORAL VENOUS CANN RAP (CANNULA) IMPLANT
GAUZE SPONGE 4X4 12PLY STRL (GAUZE/BANDAGES/DRESSINGS) ×1 IMPLANT
GAUZE SPONGE 4X4 12PLY STRL LF (GAUZE/BANDAGES/DRESSINGS) ×3 IMPLANT
GLOVE BIO SURGEON STRL SZ 6 (GLOVE) ×3 IMPLANT
GLOVE BIO SURGEON STRL SZ 6.5 (GLOVE) ×4 IMPLANT
GLOVE ORTHO TXT STRL SZ7.5 (GLOVE) ×9 IMPLANT
GOWN STRL REUS W/ TWL LRG LVL3 (GOWN DISPOSABLE) ×8 IMPLANT
GOWN STRL REUS W/TWL LRG LVL3 (GOWN DISPOSABLE) ×8
IV NS 1000ML (IV SOLUTION) ×1
IV NS 1000ML BAXH (IV SOLUTION) IMPLANT
IV NS IRRIG 3000ML ARTHROMATIC (IV SOLUTION) ×1 IMPLANT
KIT BASIN OR (CUSTOM PROCEDURE TRAY) ×3 IMPLANT
KIT DILATOR VASC 18G NDL (KITS) ×3 IMPLANT
KIT DRAINAGE VACCUM ASSIST (KITS) ×1 IMPLANT
KIT SUCTION CATH 14FR (SUCTIONS) ×3 IMPLANT
KIT SUT CK MINI COMBO 4X17 (Prosthesis & Implant Heart) ×1 IMPLANT
KIT TURNOVER KIT B (KITS) ×3 IMPLANT
LEAD PACING MYOCARDI (MISCELLANEOUS) ×3 IMPLANT
LINE VENT (MISCELLANEOUS) ×1 IMPLANT
NDL AORTIC ROOT 14G 7F (CATHETERS) ×2 IMPLANT
NEEDLE AORTIC ROOT 14G 7F (CATHETERS) ×3 IMPLANT
NS IRRIG 1000ML POUR BTL (IV SOLUTION) ×15 IMPLANT
PACK E MIN INVASIVE VALVE (SUTURE) ×3 IMPLANT
PACK OPEN HEART (CUSTOM PROCEDURE TRAY) ×3 IMPLANT
PAD ARMBOARD 7.5X6 YLW CONV (MISCELLANEOUS) ×6 IMPLANT
PAD ELECT DEFIB RADIOL ZOLL (MISCELLANEOUS) ×3 IMPLANT
PERCLOSE PROGLIDE 6F (VASCULAR PRODUCTS) ×15
RETRACTOR WND ALEXIS 18 MED (MISCELLANEOUS) ×2 IMPLANT
RING MITRAL MEMO 4D 36 (Prosthesis & Implant Heart) ×1 IMPLANT
RTRCTR WOUND ALEXIS 18CM MED (MISCELLANEOUS) ×3
SET CANNULATION TOURNIQUET (MISCELLANEOUS) ×4 IMPLANT
SET CARDIOPLEGIA MPS 5001102 (MISCELLANEOUS) ×1 IMPLANT
SET IRRIG TUBING LAPAROSCOPIC (IRRIGATION / IRRIGATOR) ×3 IMPLANT
SET MICROPUNCTURE 5F STIFF (MISCELLANEOUS) ×1 IMPLANT
SHEATH PINNACLE 8F 10CM (SHEATH) ×3 IMPLANT
SOLUTION ANTI FOG 6CC (MISCELLANEOUS) ×3 IMPLANT
SUT BONE WAX W31G (SUTURE) ×3 IMPLANT
SUT ETHIBOND (SUTURE) ×2 IMPLANT
SUT ETHIBOND 2 0 SH (SUTURE) ×2 IMPLANT
SUT ETHIBOND 2-0 RB-1 WHT (SUTURE) ×2 IMPLANT
SUT ETHIBOND X763 2 0 SH 1 (SUTURE) ×3 IMPLANT
SUT GORETEX CV 4 TH 22 36 (SUTURE) ×3 IMPLANT
SUT GORETEX CV4 TH-18 (SUTURE) ×6 IMPLANT
SUT PROLENE 3 0 SH DA (SUTURE) ×2 IMPLANT
SUT PROLENE 3 0 SH1 36 (SUTURE) ×12 IMPLANT
SUT PTFE CHORD X 20MM (SUTURE) ×2 IMPLANT
SUT SILK 2 0 SH CR/8 (SUTURE) IMPLANT
SUT SILK 3 0 SH CR/8 (SUTURE) IMPLANT
SUT VIC AB 2-0 CTX 36 (SUTURE) IMPLANT
SUT VIC AB 3-0 SH 8-18 (SUTURE) IMPLANT
SUT VICRYL 2 TP 1 (SUTURE) IMPLANT
SYR 10ML LL (SYRINGE) ×3 IMPLANT
SYSTEM SAHARA CHEST DRAIN ATS (WOUND CARE) ×3 IMPLANT
TAPE CLOTH SURG 4X10 WHT LF (GAUZE/BANDAGES/DRESSINGS) ×1 IMPLANT
TAPE PAPER 2X10 WHT MICROPORE (GAUZE/BANDAGES/DRESSINGS) ×1 IMPLANT
TOWEL GREEN STERILE (TOWEL DISPOSABLE) ×3 IMPLANT
TOWEL GREEN STERILE FF (TOWEL DISPOSABLE) ×3 IMPLANT
TRAY FOLEY SLVR 16FR TEMP STAT (SET/KITS/TRAYS/PACK) ×3 IMPLANT
TROCAR XCEL BLADELESS 5X75MML (TROCAR) ×3 IMPLANT
TROCAR XCEL NON-BLD 11X100MML (ENDOMECHANICALS) ×6 IMPLANT
TUBE SUCT INTRACARD DLP 20F (MISCELLANEOUS) ×3 IMPLANT
TUNNELER SHEATH ON-Q 11GX8 DSP (PAIN MANAGEMENT) ×1 IMPLANT
UNDERPAD 30X30 (UNDERPADS AND DIAPERS) ×3 IMPLANT
WATER STERILE IRR 1000ML POUR (IV SOLUTION) ×6 IMPLANT
WIRE .035 3MM-J 145CM (WIRE) ×3 IMPLANT
WIRE EMERALD 3MM-J .035X150CM (WIRE) ×1 IMPLANT

## 2018-12-26 NOTE — Interval H&P Note (Signed)
History and Physical Interval Note:  12/26/2018 6:18 AM  Derrick Craig.  has presented today for surgery, with the diagnosis of MR.  The various methods of treatment have been discussed with the patient and family. After consideration of risks, benefits and other options for treatment, the patient has consented to  Procedure(s): MINIMALLY INVASIVE MITRAL VALVE REPAIR (MVR) (Right) TRANSESOPHAGEAL ECHOCARDIOGRAM (TEE) (N/A) as a surgical intervention.  The patient's history has been reviewed, patient examined, no change in status, stable for surgery.  I have reviewed the patient's chart and labs.  Questions were answered to the patient's satisfaction.     Rexene Alberts

## 2018-12-26 NOTE — Brief Op Note (Addendum)
12/26/2018  11:59 AM  PATIENT:  Derrick Craig.  58 y.o. male  PRE-OPERATIVE DIAGNOSIS:  MR  POST-OPERATIVE DIAGNOSIS:  MR  PROCEDURE:  Procedure(s): MINIMALLY INVASIVE MITRAL VALVE REPAIR (MVR) using Memo 4D Ring Size 48mm  Neo chord implant to posterior leaflet  TRANSESOPHAGEAL ECHOCARDIOGRAM (TEE) (N/A)  SURGEON: Rexene Alberts, MD  PHYSICIAN ASSISTANT: Roddenberry  ASSISTANTS: Vernie Murders, RNFA  ANESTHESIA:   general  EBL: 250 mL  BLOOD ADMINISTERED:none  DRAINS: Mediastinal and right pleural tubes.   LOCAL MEDICATIONS USED:  BUPIVICAINE   SPECIMEN:  No Specimen  DISPOSITION OF SPECIMEN:  N/A  COUNTS:  YES  DICTATION: .Dragon Dictation  PLAN OF CARE: Admit to inpatient   PATIENT DISPOSITION:  ICU - intubated and hemodynamically stable.   Delay start of Pharmacological VTE agent (>24hrs) due to surgical blood loss or risk of bleeding: yes

## 2018-12-26 NOTE — Progress Notes (Signed)
Patient came to unit with On-Q. Order says discontinued, however, surgeon said to keep it in place.

## 2018-12-26 NOTE — Progress Notes (Signed)
  Echocardiogram Echocardiogram Transesophageal has been performed.  Johny Chess 12/26/2018, 8:48 AM

## 2018-12-26 NOTE — Progress Notes (Signed)
Patient ID: Derrick Craig., male   DOB: 1961-05-29, 58 y.o.   MRN: [id number] EVENING ROUNDS NOTE :     [address]       [place] [address]             [phone]                 Day of Surgery Procedure(s) (LRB): MINIMALLY INVASIVE MITRAL VALVE REPAIR (MVR) using Memo 4D Ring Size 69mm (Right) TRANSESOPHAGEAL ECHOCARDIOGRAM (TEE) (N/A)  Total Length of Stay:  LOS: 0 days  BP 131/71   Pulse 76   Temp 98.4 F (36.9 C)   Resp 10   Ht 6' (1.829 m)   Wt 88.5 kg   SpO2 100%   BMI 26.45 kg/m   .Intake/Output      06/15 0701 - 06/16 0700 06/16 0701 - 06/17 0700   I.V. (mL/kg)  2075.7 (23.5)   Blood  150   IV Piggyback  333.5   Total Intake(mL/kg)  2559.1 (28.9)   Urine (mL/kg/hr)  1800 (2)   Blood  350   Chest Tube  160   Total Output  2310   Net  +249.1          . sodium chloride    . sodium chloride 100 mL/hr at 12/26/18 1600  . [START ON 12/27/2018] sodium chloride    . sodium chloride 20 mL/hr at 12/26/18 1600  . albumin human    . cefUROXime (ZINACEF)  IV    . dexmedetomidine (PRECEDEX) IV infusion    . famotidine (PEPCID) IV Stopped (12/26/18 1419)  . lactated ringers    . lactated ringers    . lactated ringers    . magnesium sulfate 20 mL/hr at 12/26/18 1600  . nitroGLYCERIN 60 mcg/min (12/26/18 1600)  . phenylephrine (NEO-SYNEPHRINE) Adult infusion Stopped (12/26/18 1314)  . potassium chloride    . vancomycin       Lab Results  Component Value Date   WBC 14.0 (H) 12/26/2018   HGB 12.5 (L) 12/26/2018   HCT 36.7 (L) 12/26/2018   PLT 178 12/26/2018   GLUCOSE 144 (H) 12/26/2018   ALT 30 12/22/2018   AST 27 12/22/2018   NA 140 12/26/2018   K 3.8 12/26/2018   CL 104 12/22/2018   CREATININE 0.82 12/22/2018   BUN 25 (H) 12/22/2018   CO2 26 12/22/2018   INR 1.4 (H) 12/26/2018   HGBA1C 5.2 12/22/2018    Extubated , neuro intact No bleeding Ci 1.88   Grace Isaac MD  Beeper [phone] Office [phone] 12/26/2018  5:05 PM

## 2018-12-26 NOTE — Transfer of Care (Signed)
Immediate Anesthesia Transfer of Care Note  Patient: Derrick Craig.  Procedure(s) Performed: MINIMALLY INVASIVE MITRAL VALVE REPAIR (MVR) using Memo 4D Ring Size 57mm (Right Chest) TRANSESOPHAGEAL ECHOCARDIOGRAM (TEE) (N/A )  Patient Location: SICU  Anesthesia Type:General  Level of Consciousness: alert , oriented, patient cooperative and responds to stimulation  Airway & Oxygen Therapy: Patient Spontanous Breathing and Patient connected to face mask oxygen  Post-op Assessment: Report given to RN, Post -op Vital signs reviewed and stable and Patient moving all extremities X 4  Post vital signs: Reviewed and stable  Last Vitals:  Vitals Value Taken Time  BP    Temp    Pulse    Resp    SpO2      Last Pain:  Vitals:   12/26/18 0623  TempSrc:   PainSc: 0-No pain      Patients Stated Pain Goal: 2 (07/13/70 5366)  Complications: No apparent anesthesia complications

## 2018-12-26 NOTE — Anesthesia Procedure Notes (Signed)
Central Venous Catheter Insertion Performed by: Catalina Gravel, MD, anesthesiologist Start/End6/16/2020 6:45 AM, 12/26/2018 6:55 AM Patient location: Pre-op. Preanesthetic checklist: patient identified, IV checked, site marked, risks and benefits discussed, surgical consent, monitors and equipment checked, pre-op evaluation, timeout performed and anesthesia consent Lidocaine 1% used for infiltration and patient sedated Hand hygiene performed  and maximum sterile barriers used  Catheter size: 8.5 Fr Central line was placed.Sheath introducer Procedure performed using ultrasound guided technique. Ultrasound Notes:anatomy identified, needle tip was noted to be adjacent to the nerve/plexus identified, no ultrasound evidence of intravascular and/or intraneural injection and image(s) printed for medical record Attempts: 1 Following insertion, line sutured, dressing applied and Biopatch. Post procedure assessment: blood return through all ports, free fluid flow and no air  Patient tolerated the procedure well with no immediate complications.

## 2018-12-26 NOTE — Anesthesia Procedure Notes (Signed)
Arterial Line Insertion Start/End6/16/2020 6:50 AM, 12/26/2018 7:00 AM Performed by: Annye Asa, MD, Verdie Drown, CRNA, CRNA  Patient location: Pre-op. Preanesthetic checklist: patient identified, IV checked, site marked, risks and benefits discussed, surgical consent, monitors and equipment checked, pre-op evaluation, timeout performed and anesthesia consent Lidocaine 1% used for infiltration and patient sedated Left, radial was placed Catheter size: 20 G Hand hygiene performed  and maximum sterile barriers used   Attempts: 2 (1st attempt unable to thread cath into artery.) Procedure performed without using ultrasound guided technique. Following insertion, Biopatch and dressing applied. Post procedure assessment: normal  Patient tolerated the procedure well with no immediate complications.

## 2018-12-26 NOTE — Anesthesia Procedure Notes (Signed)
Central Venous Catheter Insertion Performed by: Catalina Gravel, MD, anesthesiologist Start/End6/16/2020 6:55 AM, 12/26/2018 7:00 AM Patient location: Pre-op. Preanesthetic checklist: patient identified, IV checked, site marked, risks and benefits discussed, surgical consent, monitors and equipment checked, pre-op evaluation, timeout performed and anesthesia consent Hand hygiene performed  and maximum sterile barriers used  Total catheter length 100. PA cath was placed.Swan type:thermodilution PA Cath depth:65 Procedure performed without using ultrasound guided technique. Attempts: 1 Patient tolerated the procedure well with no immediate complications.

## 2018-12-26 NOTE — Anesthesia Postprocedure Evaluation (Signed)
Anesthesia Post Note  Patient: Nikolay Demetriou.  Procedure(s) Performed: MINIMALLY INVASIVE MITRAL VALVE REPAIR (MVR) using Memo 4D Ring Size 43mm (Right Chest) TRANSESOPHAGEAL ECHOCARDIOGRAM (TEE) (N/A )     Patient location during evaluation: SICU Anesthesia Type: General Level of consciousness: awake and alert, patient cooperative and oriented Pain management: pain level controlled (R shoulder sore) Vital Signs Assessment: post-procedure vital signs reviewed and stable Respiratory status: spontaneous breathing, nonlabored ventilation, respiratory function stable and patient connected to nasal cannula oxygen Cardiovascular status: stable (requiring NTG for BP control) Postop Assessment: no apparent nausea or vomiting Anesthetic complications: no    Last Vitals:  Vitals:   12/26/18 1830 12/26/18 1845  BP:    Pulse: 77 78  Resp: 10 18  Temp: 37 C 36.9 C  SpO2: 100% 100%    Last Pain:  Vitals:   12/26/18 1700  TempSrc:   PainSc: 2                  Carnesha Maravilla,E. Javarion Douty

## 2018-12-26 NOTE — Op Note (Signed)
CARDIOTHORACIC SURGERY OPERATIVE NOTE  Date of Procedure:  12/26/2018  Preoperative Diagnosis: Severe Mitral Regurgitation  Postoperative Diagnosis: Same  Procedure:    Minimally-Invasive Mitral Valve Repair  Complex valvuloplasty including artificial Gore-tex neochord placement x10  Sorin Memo 4D Ring Annuloplasty (size 27mm, catalog # [id number], serial # [id number])    Surgeon: Valentina Gu. 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  Patient states that he was first noted to have a heart murmur on physical exam several years ago. He was referred to Dr. Einar Gip who has been following him closely ever since. Transthoracic echocardiograms have documented the presence of mitral valve prolapse with mitral regurgitation that is gradually progressed in severity. Most recent follow-up echocardiogram performed August 15, 2018 revealed significant left ventricular chamber enlargement. The patient subsequently underwent transesophageal echocardiogram and diagnostic cardiac catheterization on September 05, 2018. Catheterization revealed normal coronary arteries with normal right heart pressures. TEE confirmed the presence of mitral valve prolapse with moderate to severe mitral regurgitation and mild left ventricular chamber enlargement. The  patient was referred for surgical consultation.  The patient has been seen in consultation and counseled at length regarding the indications, risks and potential benefits of surgery.  All questions have been answered, and the patient provides full informed consent for the operation as described.    DETAILS OF THE OPERATIVE PROCEDURE  Preparation:  The patient is brought to the operating room on the above mentioned date and central monitoring was established by the anesthesia team including placement of Swan-Ganz catheter through the left internal jugular vein.  A radial arterial line is placed. The patient is placed in the supine position on the operating table.  Intravenous antibiotics are administered. General endotracheal anesthesia is induced uneventfully. The patient is initially intubated using a dual lumen endotracheal tube.  A Foley catheter is placed.  Baseline transesophageal echocardiogram was performed.  Findings were notable for myxomatous degenerative disease of the mitral valve with bileaflet prolapse and severe mitral regurgitation.  There were multiple elongated primary chordae tendinae but there were no obviously ruptured cords.  The left ventricle was mildly dilated and there was mild left ventricular systolic dysfunction with ejection fraction estimated 50%.  The aortic valve is normal.  There was no evidence for patent foramen ovale even using agitated albumin administration.  Right ventricular size and function was normal.  There was mild tricuspid regurgitation.  No other abnormalities were noted.  A soft roll is placed behind the patient's left scapula and the neck gently extended and turned to the left.   The patient's right neck, chest, abdomen, both groins, and both lower extremities are prepared and draped in a sterile manner. A time out procedure is performed.   Percutaneous Vascular Access:  Percutaneous arterial and venous access were obtained on the right side.   Using ultrasound guidance the right common femoral vein was cannulated using the Seldinger  technique and a pair of Perclose vascular closure devises were placed, after which time an 8 French sheath inserted.  The right common femoral artery was cannulated using a micropuncture wire and sheath.  A pair of Perclose vascular closure devices were placed at opposing 30 degree angles in the femoral artery, and a 8 French sheath inserted.  The right internal jugular vein was cannulated  using ultrasound guidance and an 8 French sheath inserted.     Surgical Approach:  A right miniature anterolateral thoracotomy incision is performed. The incision is placed just lateral to and superior to the right nipple. The pectoralis major muscle is retracted medially and completely preserved. The right pleural space is entered through the 4th intercostal space. A soft tissue retractor is placed.  Two 11 mm ports are placed through separate stab incisions inferiorly. The right pleural space is insufflated continuously with carbon dioxide gas through the posterior port during the remainder of the operation.  A pledgeted sutures placed through the dome of the right hemidiaphragm and retracted inferiorly to facilitate exposure.  A longitudinal incision is made in the pericardium 3 cm anterior to the phrenic nerve and silk traction sutures are placed on either side of the incision for exposure.   Extracorporeal Cardiopulmonary Bypass and Myocardial Protection:  The patient was heparinized systemically.  The right common femoral vein is cannulated through the venous sheath and a guidewire advanced into the right atrium using TEE guidance.  The femoral vein cannulated using a 22 Fr long femoral venous cannula.  The right common femoral artery is cannulated through the arterial sheath and a guidewire advanced into the descending thoracic aorta using TEE guidance.  Femoral artery is cannulated with a 18 French femoral arterial  cannula.  The right internal jugular vein is cannulated through the venous sheath and a guidewire advanced into the right atrium.  The internal jugular vein is cannulated using a 14 French pediatric femoral venous cannula.   Adequate heparinization is verified.   The entire pre-bypass portion of the operation was notable for stable hemodynamics.  Cardiopulmonary bypass was begun.  Vacuum assist venous drainage is utilized. The incision in the pericardium is extended in both directions. Venous drainage and exposure are notably excellent. A retrograde cardioplegia cannula is placed through the right atrium into the coronary sinus using transesophageal echocardiogram guidance.  An antegrade cardioplegia cannula is placed in the ascending aorta.    The patient is cooled to 32C systemic temperature.  The aortic cross clamp is applied and cardioplegia is delivered initially in an antegrade fashion through the aortic root using modified [place] cold blood cardioplegia ([place] blood cardioplegia protocol).   Supplemental cardioplegia was administered retrograde through the coronary sinus catheter.  The initial cardioplegic arrest is rapid with early diastolic arrest.   Myocardial protection was felt to be excellent.   Mitral Valve Repair:  A left atriotomy incision was performed through the interatrial groove and extended partially across the back wall of the left atrium after opening the oblique sinus inferiorly.  The mitral valve is exposed using a self-retaining retractor.  The mitral valve was inspected and notable for forme fruste variant of Barlow's type myxomatous degenerative disease.  There is bileaflet billowing and prolapse with moderately redundant leaflet tissue, particular involving the posterior leaflet.  There are multiple elongated primary chordae tendinae.  There are no ruptured chordae tendinae.  Interrupted 2-0 Ethibond horizontal mattress sutures are placed circumferentially around  the entire mitral valve annulus. The sutures will ultimately  be utilized for ring annuloplasty, and at this juncture there are utilized to suspend the valve symmetrically.  Artificial neochord placement was performed using Chord-X multi-strand CV-4 Goretex pre-measured loops.  The appropriate cord length was measured from corresponding normal length primary cords from the P1 segment of the posterior leaflet. The papillary muscle suture of a Chord-X multi-strand suture was placed through the head of the posterior papillary muscle in a horizontal mattress fashion and tied over Teflon felt pledgets. Two of the three pre-measured loops were then reimplanted into the free margin of the P2 segment of the posterior leaflet on the posterior side of midline.  The final premeasured loop was discarded.  The papillary muscle suture of a second Chord-X multi-strand suture was placed through the head of the anterior papillary muscle in a horizontal mattress fashion and tied over Teflon felt pledgets. Each of the three pre-measured loops were then reimplanted into the free margin of the P2 segment of the posterior leaflet on the anterior side of midline.  A total of 5 pairs or 10 artificial neochords were placed.  The valve was tested with saline and appeared competent even without ring annuloplasty complete. The valve was sized to a 36 mm annuloplasty ring, based upon the transverse distance between the left and right commissures and the height of the anterior leaflet, corresponding to a size just slightly larger than the overall surface area of the anterior leaflet.  A Sorin Memo 4D annuloplasty ring (size 36mm, catalog #[id number], serial #[id number]) was secured in place uneventfully. All ring sutures were secured using a Cor-knot device.    The valve was tested with saline and appeared competent. There is no residual leak. There was a broad, symmetrical line of coaptation of the anterior and posterior leaflet which was  confirmed using the blue ink test.  Rewarming is begun.   Procedure Completion:  The atriotomy was closed using a 2-layer closure of running 3-0 Prolene suture after placing a sump drain across the mitral valve to serve as a left ventricular vent.  One final dose of warm retrograde "reanimation dose" cardioplegia was administered retrograde through the coronary sinus catheter while all air was evacuated through the aortic root.  The aortic cross clamp was removed after a total cross clamp time of 100 minutes.  Epicardial pacing wires are fixed to the inferior wall of the right ventricule and to the right atrial appendage. The patient is rewarmed to 37C temperature. The left ventricular vent is removed.  The patient is ventilated and flow volumes turndown while the mitral valve repair is inspected using transesophageal echocardiogram. The valve repair appears intact with no residual leak. The antegrade cardioplegia cannula is now removed. The patient is weaned and disconnected from cardiopulmonary bypass.  The patient's rhythm at separation from bypass was sinus.  The patient was weaned from bypass without any inotropic support. Total cardiopulmonary bypass time for the operation was 137 minutes.  Followup transesophageal echocardiogram performed after separation from bypass revealed a well-seated annuloplasty ring in the mitral position with a normal functioning mitral valve. There was no residual leak.  Left ventricular function was unchanged from preoperatively.  The mean gradient across the mitral valve was estimated to be <3 mmHg.  Protamine was administered to reverse the anticoagulation. The femoral arterial and venous cannulae were removed uneventfully and Perclose sutures secured.  The right internal jugular cannula was removed and manual pressure held on the neck for 15 minutes.  Single lung ventilation was begun. The atriotomy closure  was inspected for hemostasis. The pericardial sac was  drained using a 28 French Bard drain placed through the anterior port incision.  The right pleural space is irrigated with saline solution and inspected for hemostasis.   The On-Q pain management system is utilized for postoperative analgesia.  A single lumen catheter is passed through the subcutaneous tissues from the anterior chest wall to the posterior port incision.  The catheter was then passed through the port incision into the pleural space and tunneled into the subpleural space posteriorly to cover the second through the sixth intercostal nerve roots.  The catheter was flushed with 0.5% bupivacaine solution and ultimately connected to a continuous infusion pump.  The right pleural space was drained using a 28 French Bard drain placed through the posterior port incision. The miniature thoracotomy incision was closed in multiple layers in routine fashion. The right groin incision was inspected for hemostasis and closed in multiple layers in routine fashion.  The post-bypass portion of the operation was notable for stable rhythm and hemodynamics.  No blood products were administered during the operation.   Disposition:  The patient tolerated the procedure well.  The patient was extubated in the operating room and subsequently transported to the surgical intensive care unit in stable condition. There were no intraoperative complications. All sponge instrument and needle counts are verified correct at completion of the operation.     Salvatore Decentlarence H. Cornelius Moraswen MD 12/26/2018 12:48 PM

## 2018-12-27 ENCOUNTER — Other Ambulatory Visit: Payer: Self-pay

## 2018-12-27 ENCOUNTER — Encounter (HOSPITAL_COMMUNITY): Payer: Self-pay | Admitting: Thoracic Surgery (Cardiothoracic Vascular Surgery)

## 2018-12-27 ENCOUNTER — Inpatient Hospital Stay (HOSPITAL_COMMUNITY): Payer: Federal, State, Local not specified - PPO

## 2018-12-27 DIAGNOSIS — J9811 Atelectasis: Secondary | ICD-10-CM | POA: Diagnosis not present

## 2018-12-27 DIAGNOSIS — Z4682 Encounter for fitting and adjustment of non-vascular catheter: Secondary | ICD-10-CM | POA: Diagnosis not present

## 2018-12-27 LAB — CBC
HCT: 34.1 % — ABNORMAL LOW (ref 39.0–52.0)
HCT: 36.6 % — ABNORMAL LOW (ref 39.0–52.0)
Hemoglobin: 12 g/dL — ABNORMAL LOW (ref 13.0–17.0)
Hemoglobin: 12.7 g/dL — ABNORMAL LOW (ref 13.0–17.0)
MCH: 32.4 pg (ref 26.0–34.0)
MCH: 32.2 pg (ref 26.0–34.0)
MCHC: 35.2 g/dL (ref 30.0–36.0)
MCHC: 34.7 g/dL (ref 30.0–36.0)
MCV: 92.2 fL (ref 80.0–100.0)
MCV: 92.7 fL (ref 80.0–100.0)
Platelets: 170 10*3/uL (ref 150–400)
Platelets: 192 10*3/uL (ref 150–400)
RBC: 3.7 MIL/uL — ABNORMAL LOW (ref 4.22–5.81)
RBC: 3.95 MIL/uL — ABNORMAL LOW (ref 4.22–5.81)
RDW: 11.9 % (ref 11.5–15.5)
RDW: 12.1 % (ref 11.5–15.5)
WBC: 8.9 10*3/uL (ref 4.0–10.5)
WBC: 12.1 10*3/uL — ABNORMAL HIGH (ref 4.0–10.5)
nRBC: 0 % (ref 0.0–0.2)
nRBC: 0 % (ref 0.0–0.2)

## 2018-12-27 LAB — POCT I-STAT EG7
Acid-base deficit: 2 mmol/L (ref 0.0–2.0)
Bicarbonate: 24.9 mmol/L (ref 20.0–28.0)
Calcium, Ion: 1.15 mmol/L (ref 1.15–1.40)
HCT: 36 % — ABNORMAL LOW (ref 39.0–52.0)
Hemoglobin: 12.2 g/dL — ABNORMAL LOW (ref 13.0–17.0)
O2 Saturation: 98 %
Patient temperature: 36.1
Potassium: 4 mmol/L (ref 3.5–5.1)
Sodium: 140 mmol/L (ref 135–145)
TCO2: 26 mmol/L (ref 22–32)
pCO2, Ven: 47.9 mmHg (ref 44.0–60.0)
pH, Ven: 7.32 (ref 7.250–7.430)
pO2, Ven: 116 mmHg — ABNORMAL HIGH (ref 32.0–45.0)

## 2018-12-27 LAB — GLUCOSE, CAPILLARY
Glucose-Capillary: 110 mg/dL — ABNORMAL HIGH (ref 70–99)
Glucose-Capillary: 134 mg/dL — ABNORMAL HIGH (ref 70–99)
Glucose-Capillary: 134 mg/dL — ABNORMAL HIGH (ref 70–99)
Glucose-Capillary: 140 mg/dL — ABNORMAL HIGH (ref 70–99)
Glucose-Capillary: 84 mg/dL (ref 70–99)

## 2018-12-27 LAB — BASIC METABOLIC PANEL
Anion gap: 7 (ref 5–15)
Anion gap: 7 (ref 5–15)
BUN: 17 mg/dL (ref 6–20)
BUN: 16 mg/dL (ref 6–20)
CO2: 22 mmol/L (ref 22–32)
CO2: 27 mmol/L (ref 22–32)
Calcium: 7.9 mg/dL — ABNORMAL LOW (ref 8.9–10.3)
Calcium: 8.2 mg/dL — ABNORMAL LOW (ref 8.9–10.3)
Chloride: 107 mmol/L (ref 98–111)
Chloride: 104 mmol/L (ref 98–111)
Creatinine, Ser: 0.83 mg/dL (ref 0.61–1.24)
Creatinine, Ser: 0.88 mg/dL (ref 0.61–1.24)
GFR calc Af Amer: >60 mL/min (ref >60)
GFR calc Af Amer: >60 mL/min (ref >60)
GFR calc non Af Amer: >60 mL/min (ref >60)
GFR calc non Af Amer: >60 mL/min (ref >60)
Glucose, Bld: 147 mg/dL — ABNORMAL HIGH (ref 70–99)
Glucose, Bld: 115 mg/dL — ABNORMAL HIGH (ref 70–99)
Potassium: 4.6 mmol/L (ref 3.5–5.1)
Potassium: 4 mmol/L (ref 3.5–5.1)
Sodium: 136 mmol/L (ref 135–145)
Sodium: 138 mmol/L (ref 135–145)

## 2018-12-27 LAB — MAGNESIUM
Magnesium: 2.6 mg/dL — ABNORMAL HIGH (ref 1.7–2.4)
Magnesium: 2.3 mg/dL (ref 1.7–2.4)

## 2018-12-27 MED ORDER — WARFARIN SODIUM 2.5 MG PO TABS
2.5000 mg | ORAL_TABLET | Freq: Every day | ORAL | Status: DC
  Administered 2018-12-27 – 2018-12-28 (×2): 2.5 mg via ORAL
  Filled 2018-12-27 (×2): qty 1

## 2018-12-27 MED ORDER — WARFARIN - PHYSICIAN DOSING INPATIENT: Freq: Every day | Status: DC

## 2018-12-27 MED ORDER — METOPROLOL TARTRATE 12.5 MG HALF TABLET
12.5000 mg | ORAL_TABLET | Freq: Two times a day (BID) | ORAL | Status: DC
  Administered 2018-12-28 – 2018-12-31 (×6): 12.5 mg via ORAL
  Filled 2018-12-27 (×6): qty 1

## 2018-12-27 MED ORDER — KETOROLAC TROMETHAMINE 15 MG/ML IJ SOLN
15.0000 mg | Freq: Four times a day (QID) | INTRAMUSCULAR | Status: AC
  Administered 2018-12-27 – 2018-12-28 (×5): 15 mg via INTRAVENOUS
  Filled 2018-12-27 (×5): qty 1

## 2018-12-27 MED ORDER — ENOXAPARIN SODIUM 30 MG/0.3ML ~~LOC~~ SOLN
30.0000 mg | Freq: Every day | SUBCUTANEOUS | Status: DC
  Administered 2018-12-28 – 2018-12-30 (×3): 30 mg via SUBCUTANEOUS
  Filled 2018-12-27 (×3): qty 0.3

## 2018-12-27 MED ORDER — METOCLOPRAMIDE HCL 5 MG/ML IJ SOLN
10.0000 mg | Freq: Four times a day (QID) | INTRAMUSCULAR | Status: AC
  Administered 2018-12-27 – 2018-12-28 (×4): 10 mg via INTRAVENOUS
  Filled 2018-12-27 (×4): qty 2

## 2018-12-27 MED ORDER — FUROSEMIDE 10 MG/ML IJ SOLN
20.0000 mg | Freq: Two times a day (BID) | INTRAMUSCULAR | Status: AC
  Administered 2018-12-27 – 2018-12-28 (×3): 20 mg via INTRAVENOUS
  Filled 2018-12-27 (×3): qty 2

## 2018-12-27 MED FILL — Heparin Sodium (Porcine) Inj 1000 Unit/ML: INTRAMUSCULAR | Qty: 30 | Status: AC

## 2018-12-27 MED FILL — Magnesium Sulfate Inj 50%: INTRAMUSCULAR | Qty: 10 | Status: AC

## 2018-12-27 MED FILL — Potassium Chloride Inj 2 mEq/ML: INTRAVENOUS | Qty: 40 | Status: AC

## 2018-12-27 NOTE — Discharge Summary (Signed)
Physician Discharge Summary  Patient ID: Derrick DameWallace E Wichert Jr. MRN: [id number] DOB/AGE: 1960-10-09 58 y.o.  Admit date: 12/26/2018 Discharge date: 12/31/2018  Admission Diagnoses: Mitral valve prolapse Severe mitral insufficiency Dyspnea on exertion  Discharge Diagnoses:  Principal Problem:   S/P minimally-invasive mitral valve repair Active Problems:   Dyspnea on exertion   Mitral valve prolapse   Severe mitral valve regurgitation   Discharged Condition: good  History of Present Illness  Patient is a 58 year old male with history of mitral valve prolapse who has been referred for surgical consultation to discuss treatment options for management of severe symptomatic primary mitral regurgitation.  Patient states that he was first noted to have a heart murmur on physical exam several years ago. He was referred to Dr. Jacinto HalimGanji who has been following him closely ever since. Transthoracic echocardiograms have documented the presence of mitral valve prolapse with mitral regurgitation that is gradually progressed in severity. Most recent follow-up echocardiogram performed August 15, 2018 revealed significant left ventricular chamber enlargement. The patient subsequently underwent transesophageal echocardiogram and diagnostic cardiac catheterization on September 05, 2018. Catheterization revealed normal coronary arteries with normal right heart pressures. TEE confirmed the presence of mitral valve prolapse with moderate to severe mitral regurgitation and mild left ventricular chamber enlargement. The patient was referred for surgical consultation but his consultation was initially delayed because of the COVID-19 pandemic. In recent weeks the patient has become increasingly anxious and he requested an in person office consultation visit.  The patient is single and [place]. He works full-time for the [employer]. He works at the large [employer]  which requires fairly strenuous exertion. He has noticed over the past few years gradual progression of symptoms of exertional shortness of breath. Symptoms occur only with more strenuous exertion. He states that when he mows his lawn during that hot summer he now has to stop take breaks. Symptoms do not occur with low-level activity or at rest. He specifically denies any history of resting shortness of breath, PND, orthopnea, or lower extremity edema. He has not had any chest pain or chest tightness either with activity or at rest. Denies any palpitations, dizzy spells, or syncope. He reports no other significant physical problems and he remains relatively active physically and without other significant physical limitations.  Patient is a 58 year old male with mitral valve prolapse and severe mitral regurgitation who returns the office today for follow-up prior to elective mitral valve repair.He was originally seen in consultation on Nov 13, 2018.He returned to the office on 12/18/18 and reporte no new problems or complaints. He reported stable symptoms of exertional shortness of breath that occur only with more strenuous physical exertion, New York Heart Association functional class I-II.  He denied any fevers or productive cough. The decision was made to proceed with elective mitral valve repair and surgery was scheduled.   Hospital Course: Derrick Craig was admitted to the hospital for same-day surgery on 12/26/18 and taken to the OR where minimally invasive mitral valve repair was accomplished without complication. Please see the operative details below. The patient separated from cardiopulmonary bypass without difficulty and required no inotropic support. He was extubated at the conclusion of the procedure and transported to the CVICU where he remained hemodynamically stable.  He required low-dose nitroglycerine for control of his mild hypertension overnight after surgery. Oral metoprolol was  initiated on the fors post-op day. The NTG was weaned off. The invasive monitoring lines were removed and the patient was  mobilized. Early post-op nausea was treated with Zofran and IV Reglan. Oroal anticoagulation was initiated with warfarin on POD1 and daily INR was monitored.  He made excellent progress with regaining independent mobility and was easily weaned from supplemental O2.  The chest tubes were removed on POD3. He developed atrial fibrillation on POD3 with a controlled ventricular rate. He tolerated this well so the decision was made to continue with metopolol alone rather that add an antiarrhythmic. At the time of discharge, his INR was 1.2 and his Coumadin dose was 5 mg daily. He is felt to be stable for discharge at this time.  Consults: cardiology  Significant Diagnostic Studies:   ECHO TEE Order #: [id number] Accession #: [id number] Patient Info  Patient name: Derrick DameWallace E Brumbaugh Jr.  MRN: [id number]  Age: 58 y.o.  Sex: male  Vitals  BP Height Weight BSA (Calculated - sq m)  131/76 6' (1.829 m) 93 kg 2.17 sq meters  Study Result  Result status: Final result    TRANSESOPHOGEAL ECHO REPORT       Patient Name:   Derrick DameWallace E Comes Jr. Date of Exam: 09/05/2018 Medical Rec #:  [id number]              Height:       72.0 in Accession #:    [id number]             Weight:       205.1 lb Date of Birth:  11/25/1960             BSA:          2.15 m Patient Age:    57 years               BP:           148/76 mmHg Patient Gender: M                      HR:           68 bpm. Exam Location:  Inpatient    Procedure: Transesophageal Echo  Indications:     Mitral regurgiation   History:         Patient has no prior history of Echocardiogram examinations.                  Mitral Valve Prolapse and Mitral Valve Disease; Signs/Symptoms:                  Dyspnea on exertion.   Sonographer:     Ross LudwigArthur Guy RDCS (AE) Referring Phys:  [id number] Yates DecampJAY GANJI Diagnosing Phys: Yates DecampJay Ganji MD      PROCEDURE: Normal Transesophogeal exam. Patients was under conscious sedation during this procedure. Anesthetic was administered intravenously by performing Physician: 25mcg of Fentanyl, 2.0mg  of Versed. The patient developed no complications during the  procedure. 18 minutes of moderate sedation observation and management.  IMPRESSIONS    1. The left ventricle has normal systolic function, with an ejection fraction of 55-60%. The cavity size was mildly dilated. Left ventricular diastology could not be evaluated No evidence of left ventricular regional wall motion abnormalities.  2. Left atrial size was moderately dilated.  3. The mitral valve is myxomatous. Mild thickening of the mitral valve leaflet. Mild prolapse of the anterior (A2) and posterior (P2) MV leaflets, predominantly P2 segment. Mitral valve regurgitation is moderate to severe by color flow Doppler. The MR  jet is anteriorly-directed. There was flow reversal in the  left lower pulmonary vein and right pulmonary vein.  4. The tricuspid valve was normal in structure.  5. The aortic valve is tricuspid Aortic valve regurgitation is trivial by color flow Doppler.  6. The pulmonic valve was normal in structure.  7. The aortic root, ascending aorta, aortic arch and descending aorta are normal in size and structure.  8. Very Small patent foramen ovale with predominantly left to right shunting across the atrial septum noted by color Doppler. Double contrast study not performed.  9. No evidence of left ventricular regional wall motion abnormalities.  FINDINGS  Left Ventricle: The left ventricle has normal systolic function, with an ejection fraction of 55-60%. The cavity size was mildly dilated. There is no increase in left ventricular wall thickness. Left ventricular diastology could not be evaluated No  evidence of left ventricular regional wall motion abnormalities. Right Ventricle: The right ventricle has normal systolic  function. The cavity was Borderline enlarged. Left Atrium: Left atrial size was moderately dilated. Right Atrium: Right atrial size was normal in size. Right atrial pressure is estimated at 10 mmHg. Interatrial Septum: Double contrast study not performed. A small patent foramen ovale is detected with predominantly left to right shunting across the atrial septum. Pericardium: There is no evidence of pericardial effusion. There is no pleural effusion. Mitral Valve: The mitral valve is myxomatous. Mild thickening of the mitral valve leaflet. Mitral valve regurgitation is moderate to severe by color flow Doppler. The MR jet is anteriorly-directed. Pulmonary venous flow shows systolic flow reversal. Tricuspid Valve: The tricuspid valve was normal in structure. Tricuspid valve regurgitation is trivial by color flow Doppler. Aortic Valve: The aortic valve is tricuspid Aortic valve regurgitation is trivial by color flow Doppler. Pulmonic Valve: The pulmonic valve was normal in structure. Pulmonic valve regurgitation is not visualized by color flow Doppler. Aorta: The aortic root, ascending aorta and aortic arch are normal in size and structure.   RIGHT ATRIUM RA Pressure: 10 mmHg    Yates DecampJay Ganji MD Electronically signed by Yates DecampJay Ganji MD Signature Date/Time: 09/05/2018/3:09:10 PM        RIGHT/LEFT HEART CATH AND CORONARY ANGIOGRAPHY  Conclusion  Normal coronary arteries with superdominant right coronary artery No coronary artery disease Normal filling pressures.  Recommendation: Follow up with CVTS re: management of severe mitral regrgitation  Elder NegusManish J Patwardhan, MD [facility] Pager: [phone] Office: [phone] If no answer Cell [phone]    Recommendations  Antiplatelet/Anticoag No indication for antiplatelet therapy at this time .  Surgeon Notes    09/05/2018 11:19 AM CV Procedure signed by Yates DecampGanji, Jay, MD  Indications  Severe mitral regurgitation  [I34.0 (ICD-10-CM)]  Procedural Details  Technical Details Procedures: 1. Right heart catheterization 2. Left heart catheterization 3. Selective left and right coronary angiography 4. Conscious sedation monitoring 17 min  Indication: Severe mitral regurgitation  History: 58 y/o Caucasian male with severe mitral regurgitation here for pre-op evaluation.  Diagnostic Angiography: 5 Fr TIG 4.0  Pressures tracings obtained in right atrium, right ventricle, pulmonary artery, and pulmonary capillary wedge position.   Anticoagulation:  4500 units heparin  Hemostasis: TR band  Total contrast used: 15 cc   Total fluoro time: 2.9 min Air Kerma: 90 mGy  All wires and catheters removed out of the body at the end of the procedure Final angiogram showed no dissection/perforation         Estimated blood loss <50 mL.   During this procedure medications were administered to achieve and maintain moderate conscious sedation  while the patient's heart rate, blood pressure, and oxygen saturation were continuously monitored and I was present face-to-face 100% of this time.  Medications (Filter: Administrations occurring from 09/05/18 1137 to 09/05/18 1248) (important)  Continuous medications are totaled by the amount administered until 09/05/18 1248.  Medication Rate/Dose/Volume Action  Date Time   Heparin (Porcine) in NaCl 1000-0.9 UT/500ML-% SOLN (mL) 500 mL Given 09/05/18 1152   Total dose as of 09/05/18 1248 500 mL Given 1152   1,500 mL 500 mL Given 1159   0.9 % sodium chloride infusion (mL/hr) 10 mL/hr New Bag/Given 09/05/18 1152   Dosing weight:  93 kg        Total dose as of 09/05/18 1248        Cannot be calculated        fentaNYL (SUBLIMAZE) injection (mcg) 25 mcg Given 09/05/18 1213   Total dose as of 09/05/18 1248        25 mcg        midazolam (VERSED) injection (mg) 1 mg Given 09/05/18 1213   Total dose as of 09/05/18 1248        1 mg        lidocaine (PF)  (XYLOCAINE) 1 % injection (mL) 2 mL Given 09/05/18 1213   Total dose as of 09/05/18 1248        2 mL        heparin injection (Units) 4,500 Units Given 09/05/18 1224   Total dose as of 09/05/18 1248        4,500 Units        Radial Cocktail/Verapamil only (mL) 10 mL Given 09/05/18 1217   Total dose as of 09/05/18 1248        10 mL        iohexol (OMNIPAQUE) 350 MG/ML injection (mL) 15 mL Given 09/05/18 1236   Total dose as of 09/05/18 1248        15 mL        Sedation Time  Sedation Time Physician-1: 17 minutes 23 seconds  Complications  Complications documented before study signed (09/05/2018 12:48 PM)   RIGHT/LEFT HEART CATH AND CORONARY ANGIOGRAPHY  None Documented by Elder Negus, MD 09/05/2018 12:36 PM  Time Range: Intraprocedure      Coronary Findings  Diagnostic Dominance: Right Left Main  Vessel is normal in caliber. Vessel is angiographically normal.  Left Anterior Descending  Vessel is normal in caliber. Vessel is angiographically normal.  Left Circumflex  Vessel is normal in caliber. Vessel is angiographically normal.  Right Coronary Artery  Vessel is very large. Vessel is angiographically normal.  Intervention  No interventions have been documented. Right Heart  Right Heart Pressures RA: 5 mmHg RV: 34/3 mmHg PA: 34/11 mmHg, mean PA 20 mmHg PCWP: 9 mmHg  CO: 7.3 L/min CI: 3.4 L/min/m2  Left Heart  Left Ventricle LV end diastolic pressure is normal.  Coronary Diagrams  Diagnostic Dominance: Right  Intervention     CT ANGIOGRAPHY CHEST, ABDOMEN AND PELVIS  TECHNIQUE: Multidetector CT imaging through the chest, abdomen and pelvis was performed using the standard protocol during bolus administration of intravenous contrast. Multiplanar reconstructed images and MIPs were obtained and reviewed to evaluate the vascular anatomy.  CONTRAST: 75mL ISOVUE-370 IOPAMIDOL (ISOVUE-370) INJECTION 76%  COMPARISON: CT 08/09/2016, 01/15/2008   FINDINGS: CTA CHEST FINDINGS  Cardiovascular:  Heart:  No cardiomegaly. No pericardial fluid/thickening. No significant coronary calcifications.  Aorta:  Unremarkable course, caliber, contour of the thoracic aorta. No aneurysm  or dissection flap. No periaortic fluid. Four vessel arch with separate origin of the left vertebral artery from the aortic arch.  Pulmonary arteries:  Suboptimal bolus timing for evaluation of the pulmonary arteries. Main pulmonary artery diameter unremarkable.  Mediastinum/Nodes: No mediastinal adenopathy. Unremarkable appearance of the thoracic esophagus.  Unremarkable thoracic inlet  Lungs/Pleura: Central airways are clear. No pleural effusion. No confluent airspace disease.  No pneumothorax. Atelectasis at the lung bases.  Musculoskeletal: No acute displaced fracture. Degenerative changes of the spine.  Review of the MIP images confirms the above findings.  CTA ABDOMEN AND PELVIS FINDINGS  VASCULAR  Aorta: Unremarkable course, caliber, contour of the abdominal aorta. No dissection, aneurysm, or periaortic fluid.  Celiac: No significant atherosclerotic changes at the origin of the celiac artery. Typical branch pattern of the celiac artery, with left gastric, common hepatic, and splenic artery identified.  SMA: No significant atherosclerotic changes of the superior mesenteric artery.  Renals: Renal arteries are patent. Single left and single right renal artery.  IMA: Inferior mesenteric artery is patent. Left colic artery patent. Superior rectal artery patent.  Right lower extremity:  Unremarkable course, caliber, and contour of the right iliac system. No aneurysm, dissection, or occlusion. Hypogastric artery is patent. Anterior and posterior division patent. Common femoral artery patent. Proximal SFA and profunda femoris patent.  Left lower extremity:  Unremarkable course, caliber, and contour of  the left iliac system. No aneurysm, dissection, or occlusion. Hypogastric artery is patent. Anterior and posterior division patent. Common femoral artery patent. Proximal SFA and profunda femoris patent.  Veins: Unremarkable appearance of the venous system.  Review of the MIP images confirms the above findings.  NON-VASCULAR  Hepatobiliary: Redemonstration of small hemangioma of the right liver lobe (image 154 of series 5). Unremarkable gall bladder.  Pancreas: Unremarkable  Spleen: Unremarkable.  Adrenals/Urinary Tract: Unremarkable appearance of adrenal glands.  Right:  No hydronephrosis. Symmetric perfusion to the left. Small nonobstructive nephrolithiasis, unchanged from the comparison study. Largest stone at the lower pole collecting system measures 3 mm. Unremarkable course of the right ureter.  Left:  No hydronephrosis. Symmetric perfusion to the right. Nonobstructive nephrolithiasis of the left kidney, unchanged from prior. Single stone in the interpolar collecting system measures 4 mm. Unremarkable course of the left ureter.  Unremarkable appearance of the urinary bladder .  Stomach/Bowel: Unremarkable appearance of the stomach. Unremarkable appearance of small bowel. No evidence of obstruction. Mild colonic diverticula without evidence of acute diverticulitis. Normal appendix.  Lymphatic: Small nodes of the mesentery without adenopathy.  Mesenteric: No free fluid or air. No adenopathy.  Reproductive: Diameter of the prostate measures 5.5 cm. Internal calcifications.  Other: No hernia.  Musculoskeletal: No evidence of acute fracture. No bony canal narrowing. No significant degenerative changes of the hips.  IMPRESSION: No acute finding.  No significant atherosclerotic changes.  Redemonstration of right liver hemangioma.  Bilateral nonobstructing nephrolithiasis.  Diverticular disease without evidence of acute  diverticulitis.  Signed,  Yvone Neu. Reyne Dumas, RPVI  [facility]  [facility]   Electronically Signed By: Gilmer Mor D.O. On: 12/07/2018 12:09   Treatments:  CARDIOTHORACIC SURGERY OPERATIVE NOTE  Date of Procedure:                12/26/2018  Preoperative Diagnosis:      Severe Mitral Regurgitation  Postoperative Diagnosis:    Same  Procedure:        Minimally-Invasive Mitral Valve Repair  Complex valvuloplasty including artificial Gore-tex neochord placement x10             Sorin Memo 4D Ring Annuloplasty (size 49mm, catalog # [id number], serial # [id number])               Surgeon:        Valentina Gu. Roxy Manns, MD  Assistant:       Enid Cutter, PA-C  Anesthesia:    Midge Minium, MD  Operative Findings: ? 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 Bileaflet billowing and prolapse with multiple elongated chordae tendinae ? Type II dysfunction with severe mitral regurgitation ? Mild left ventricular systolic dysfunction ? No residual mitral regurgitation after successful valve repair                 BRIEF CLINICAL NOTE AND INDICATIONS FOR SURGERY  Patient is a 58 year old male with history of mitral valve prolapse who has been referred for surgical consultation to discuss treatment options for management of severe symptomatic primary mitral regurgitation.  Patient states that he was first noted to have a heart murmur on physical exam several years ago. He was referred to Dr. Einar Gip who has been following him closely ever since. Transthoracic echocardiograms have documented the presence of mitral valve prolapse with mitral regurgitation that is gradually progressed in severity. Most recent follow-up echocardiogram performed August 15, 2018 revealed significant left ventricular chamber enlargement. The patient subsequently  underwent transesophageal echocardiogram and diagnostic cardiac catheterization on September 05, 2018. Catheterization revealed normal coronary arteries with normal right heart pressures. TEE confirmed the presence of mitral valve prolapse with moderate to severe mitral regurgitation and mild left ventricular chamber enlargement. The patient was referred for surgical consultation.  The patient has been seen in consultation and counseled at length regarding the indications, risks and potential benefits of surgery.  All questions have been answered, and the patient provides full informed consent for the operation as described.   Discharge Exam: Blood pressure (!) 122/106, pulse 95, temperature 98.2 F (36.8 C), temperature source Oral, resp. rate 19, height 6' (1.829 m), weight 83.4 kg, SpO2 96 %.  General appearance: alert, cooperative and no distress Heart: regular rate and rhythm and no murmur, occ irreg beat Lungs: clear, + subQ air on right Abdomen: benign Extremities: no edema Wound: incis healing well Disposition: Discharge disposition: 01-Home or Self Care       Discharge Instructions    Amb Referral to Cardiac Rehabilitation   Complete by: As directed    Diagnosis: Valve Repair   Valve: Mitral   After initial evaluation and assessments completed: Virtual Based Care may be provided alone or in conjunction with Phase 2 Cardiac Rehab based on patient barriers.: Yes   Discharge patient   Complete by: As directed    Discharge disposition: 01-Home or Self Care   Discharge patient date: 12/31/2018     Allergies as of 12/31/2018   No Known Allergies     Medication List    STOP taking these medications   lisinopril 20 MG tablet Commonly known as: ZESTRIL     TAKE these medications   amiodarone 200 MG tablet Commonly known as: PACERONE Take 1 tablet (200 mg total) by mouth 2 (two) times daily after a meal.   aspirin 81 MG EC tablet Take 1 tablet (81 mg total) by  mouth daily. Start taking on: January 01, 2019   latanoprost 0.005 % ophthalmic solution Commonly known as: XALATAN Place 1 drop into  both eyes at bedtime.   metoprolol tartrate 25 MG tablet Commonly known as: LOPRESSOR Take 0.5 tablets (12.5 mg total) by mouth 2 (two) times daily.   oxyCODONE 5 MG immediate release tablet Commonly known as: Oxy IR/ROXICODONE Take 1-2 tablets (5-10 mg total) by mouth every 6 (six) hours as needed for up to 7 days for severe pain.   timolol 0.5 % ophthalmic solution Commonly known as: TIMOPTIC Place 1 drop into the left eye daily.   warfarin 5 MG tablet Commonly known as: COUMADIN Take 1 tablet (5 mg total) by mouth daily at 6 PM. As directed by coumadin clinic      Follow-up Information    [name], MD. Go on 01/03/2019.   Specialty: Cardiology Why: You have an appointment for a PT/INR (blood test) at Dr. Verl Dicker office on Wednesday 01/03/19 at 1:00pm  Contact information: [address] [place] [address] [phone]        [name], MD. Go on 01/17/2019.   Specialty: Cardiology Why: You have an appointment with Dr. Jacinto Halim on Wednesday 01/17/19 at 1:45pm. Contact information: [address] [place] [address] [phone]        [facility]. Go on 01/22/2019.   Specialty: Cardiothoracic Surgery Why: You have a follow up appointment at Dr. Orvan July office on Monday 01/22/19 at 2:30. Please arrive 30 minutes early for a chest x-ray to be done by [facility] located on the first floor of the same building.  Contact information: [address] [place], [address] [place] [address] [phone]         The patient has been discharged on:   1.Beta Blocker:  Yes [  y ]                              No   [   ]                              If No, reason:  2.Ace Inhibitor/ARB: Yes [   ]                                     No  [  n  ]                                      If No, reason:labile BP  3.Statin:   Yes [   ]                  No  [ n  ]                  If No, reason:no CAD  4.Marlowe KaysValentino Hue  [ y  ]                  No   [   ]                  If No, reason:   Signed: Rowe Clack, PA-C 12/31/2018, 11:44 AM

## 2018-12-27 NOTE — Progress Notes (Signed)
      [address]       [place] [address]             [phone]      POD # 1 MV repair  BP 125/75 (BP Location: Right Arm)   Pulse 69   Temp (!) 97 F (36.1 C) (Axillary) Comment: Let RN know about temp  Resp (!) 21   Ht 6' (1.829 m)   Wt 88.2 kg   SpO2 100%   BMI 26.37 kg/m   Intake/Output Summary (Last 24 hours) at 12/27/2018 1710 Last data filed at 12/27/2018 1600 Gross per 24 hour  Intake 1747.44 ml  Output 3695 ml  Net -1947.56 ml  Hct 36  Up in chair  Doing well POD # 1  Derrick White C. Roxan Hockey, MD [facility] [phone]

## 2018-12-27 NOTE — Progress Notes (Addendum)
TCTS DAILY ICU PROGRESS NOTE                   [address]            [place] [address]          [phone]   1 Day Post-Op Procedure(s) (LRB): MINIMALLY INVASIVE MITRAL VALVE REPAIR (MVR) using Memo 4D Ring Size 10mm (Right) TRANSESOPHAGEAL ECHOCARDIOGRAM (TEE) (N/A)  Total Length of Stay:  LOS: 1 day   Subjective: Extubated in OR prior to transfer to CVICU. Awake and alert, on RA. Sats excellent.  Denies pain but c/o nausea with vomiting x 3. No abdominal pain.   Objective: Vital signs in last 24 hours: Temp:  [97.2 F (36.2 C)-99.1 F (37.3 C)] 98.1 F (36.7 C) (06/17 0700) Pulse Rate:  [62-89] 65 (06/17 0700) Cardiac Rhythm: Normal sinus rhythm (06/17 0400) Resp:  [9-33] 15 (06/17 0700) BP: (105-127)/(62-80) 111/72 (06/17 0700) SpO2:  [97 %-100 %] 99 % (06/17 0700) Arterial Line BP: (102-155)/(57-83) 141/58 (06/17 0700) Weight:  [88.2 kg] 88.2 kg (06/17 0600)  Filed Weights   12/26/18 0559 12/27/18 0600  Weight: 88.5 kg 88.2 kg    Weight change: -0.251 kg   Hemodynamic parameters for last 24 hours: PAP: (9-32)/(1-20) 12/5 CO:  [4 L/min-7.6 L/min] 4.8 L/min CI:  [1.9 L/min/m2-3.6 L/min/m2] 2.3 L/min/m2  Intake/Output from previous day: 06/16 0701 - 06/17 0700 In: 4425.9 [I.V.:3481; Blood:150; IV Piggyback:794.9] Out: 2229 [Urine:3285; Blood:350; Chest Tube:520]  Intake/Output this shift: No intake/output data recorded.  Current Meds: Scheduled Meds: . acetaminophen  1,000 mg Oral Q6H  . aspirin EC  325 mg Oral Daily  . bisacodyl  10 mg Oral Daily   Or  . bisacodyl  10 mg Rectal Daily  . chlorhexidine  15 mL Mouth Rinse BID  . Chlorhexidine Gluconate Cloth  6 each Topical Daily  . docusate sodium  200 mg Oral Daily  . [START ON 12/28/2018] enoxaparin (LOVENOX) injection  30 mg Subcutaneous QHS  . furosemide  20 mg Intravenous BID  . insulin aspart  0-24 Units Subcutaneous Q4H  . ketorolac  15 mg Intravenous Q6H  . latanoprost  1  drop Both Eyes QHS  . mouth rinse  15 mL Mouth Rinse q12n4p  . metoCLOPramide (REGLAN) injection  10 mg Intravenous Q6H  . metoprolol tartrate  12.5 mg Oral BID  . pantoprazole  40 mg Oral Daily  . sodium chloride flush  10-40 mL Intracatheter Q12H  . sodium chloride flush  3 mL Intravenous Q12H  . timolol  1 drop Left Eye Daily  . warfarin  2.5 mg Oral q1800  . Warfarin - Physician Dosing Inpatient   Does not apply q1800   Continuous Infusions: . sodium chloride    . albumin human    . cefUROXime (ZINACEF)  IV Stopped (12/27/18 0546)  . famotidine (PEPCID) IV Stopped (12/26/18 1419)  . lactated ringers    . lactated ringers 20 mL/hr at 12/27/18 0700  . nitroGLYCERIN 30 mcg/min (12/27/18 0700)   PRN Meds:.albumin human, metoprolol tartrate, morphine injection, ondansetron (ZOFRAN) IV, oxyCODONE, sodium chloride flush, sodium chloride flush, traMADol  General appearance: alert, cooperative and mild distress Neurologic: intact Heart: regular rate and rhythm, S1, S2 normal, no murmur Lungs: clear to auscultation bilaterally. CT drainage 223ml past 12 hours.  Abdomen: soft and nontender, absent bowel sounds. Extremities: All well perfused, minimal edema.  Right groin vascular access sites are dry, no evidence of hematoma.  Wound: Right chest incision is covered with the surgical dressing and is dry and intact.   Lab Results: CBC: Recent Labs    12/26/18 1913 12/26/18 1917 12/27/18 0351  WBC 9.5  --  8.9  HGB 12.7* 12.2* 12.0*  HCT 36.6* 36.0* 34.1*  PLT 171  --  170   BMET:  Recent Labs    12/26/18 1917 12/27/18 0351  NA 136 136  K 4.6 4.6  CL 104 107  CO2  --  22  GLUCOSE 147* 147*  BUN 17 17  CREATININE 0.60* 0.83  CALCIUM  --  7.9*    CMET: Lab Results  Component Value Date   WBC 8.9 12/27/2018   HGB 12.0 (L) 12/27/2018   HCT 34.1 (L) 12/27/2018   PLT 170 12/27/2018   GLUCOSE 147 (H) 12/27/2018   ALT 30 12/22/2018   AST 27 12/22/2018   NA 136  12/27/2018   K 4.6 12/27/2018   CL 107 12/27/2018   CREATININE 0.83 12/27/2018   BUN 17 12/27/2018   CO2 22 12/27/2018   INR 1.4 (H) 12/26/2018   HGBA1C 5.2 12/22/2018      PT/INR:  Recent Labs    12/26/18 1320  LABPROT 16.9*  INR 1.4*   Radiology: Dg Chest Port 1 View  Result Date: 12/26/2018 CLINICAL DATA:  Post valve replacement EXAM: PORTABLE CHEST 1 VIEW COMPARISON:  12/22/2018 FINDINGS: A there is a left IJ approach Swan-Ganz catheter. The tip projects over the right lower lobe pulmonary artery. A right-sided chest tube and mediastinal drain are noted. No evidence of a significant pneumothorax. Postoperative atelectasis is noted at the lung bases. The patient is now status post mitral valve replacement. The lung volumes are low. The heart size is somewhat increased which is likely secondary to low lung volumes. IMPRESSION: 1. Lines and tubes as above. 2. Postoperative atelectasis at the lung bases without evidence of a pneumothorax. Electronically Signed   By: Katherine Mantlehristopher  Green M.D.   On: 12/26/2018 14:34     Assessment/Plan: S/P Procedure(s) (LRB): MINIMALLY INVASIVE MITRAL VALVE REPAIR (MVR) using Memo 4D Ring Size 36mm (Right) TRANSESOPHAGEAL ECHOCARDIOGRAM (TEE) (N/A)  -POD1 mini MV repair. Hemodynamics and cardiac rhythm stable since surgery. On no inotropic support. Will remove PA catheter and arterial line this am. Mobilize, initiate anticoagulation with warfarin, gently diurese with daily Lasix.  -Hypertension-controlled with low dose IV NTG. Starting oral metoprolol this am, wean NTG drip.   -Nausea-Abdominal exam unremarkable. Continue IV Zofran as needed and add IV reglan q6h x 4 doses.   -History of glaucoma- Timilol and Xalatan ophth gtts resumed.  -DVT PPX- continue SCD for now, add enoxaparin tomorrow evening if DT drainage has tapered off.    Gaynelle ArabianM. Roddenberry, PA-C [phone]   I have seen and examined the patient and agree with the assessment and  plan as outlined.  Doing very well POD1  Purcell Nailslarence H Eris Hannan, MD 12/27/2018

## 2018-12-28 ENCOUNTER — Inpatient Hospital Stay (HOSPITAL_COMMUNITY): Payer: Federal, State, Local not specified - PPO

## 2018-12-28 DIAGNOSIS — J9811 Atelectasis: Secondary | ICD-10-CM | POA: Diagnosis not present

## 2018-12-28 DIAGNOSIS — Z4682 Encounter for fitting and adjustment of non-vascular catheter: Secondary | ICD-10-CM | POA: Diagnosis not present

## 2018-12-28 LAB — BASIC METABOLIC PANEL
Anion gap: 5 (ref 5–15)
BUN: 15 mg/dL (ref 6–20)
CO2: 30 mmol/L (ref 22–32)
Calcium: 8.2 mg/dL — ABNORMAL LOW (ref 8.9–10.3)
Chloride: 103 mmol/L (ref 98–111)
Creatinine, Ser: 1.05 mg/dL (ref 0.61–1.24)
Glucose, Bld: 110 mg/dL — ABNORMAL HIGH (ref 70–99)
Potassium: 4 mmol/L (ref 3.5–5.1)
Sodium: 138 mmol/L (ref 135–145)

## 2018-12-28 LAB — CBC
HCT: 34.6 % — ABNORMAL LOW (ref 39.0–52.0)
Hemoglobin: 12.1 g/dL — ABNORMAL LOW (ref 13.0–17.0)
MCH: 32.4 pg (ref 26.0–34.0)
MCHC: 35 g/dL (ref 30.0–36.0)
MCV: 92.8 fL (ref 80.0–100.0)
Platelets: 161 10*3/uL (ref 150–400)
RBC: 3.73 MIL/uL — ABNORMAL LOW (ref 4.22–5.81)
RDW: 12.3 % (ref 11.5–15.5)
WBC: 10.3 10*3/uL (ref 4.0–10.5)
nRBC: 0 % (ref 0.0–0.2)

## 2018-12-28 LAB — GLUCOSE, CAPILLARY
Glucose-Capillary: 112 mg/dL — ABNORMAL HIGH (ref 70–99)
Glucose-Capillary: 175 mg/dL — ABNORMAL HIGH (ref 70–99)
Glucose-Capillary: 98 mg/dL (ref 70–99)
Glucose-Capillary: 99 mg/dL (ref 70–99)

## 2018-12-28 LAB — PROTIME-INR
INR: 1.2 (ref 0.8–1.2)
Prothrombin Time: 15.5 seconds — ABNORMAL HIGH (ref 11.4–15.2)

## 2018-12-28 MED ORDER — SODIUM CHLORIDE 0.9% FLUSH: 3.0000 mL | INTRAVENOUS | Status: DC | PRN

## 2018-12-28 MED ORDER — MOVING RIGHT ALONG BOOK
Freq: Once | Status: AC
  Administered 2018-12-28: 09:00:00
  Filled 2018-12-28: qty 1

## 2018-12-28 MED ORDER — ASPIRIN EC 81 MG PO TBEC
81.0000 mg | DELAYED_RELEASE_TABLET | Freq: Every day | ORAL | Status: DC
  Administered 2018-12-28 – 2018-12-31 (×4): 81 mg via ORAL
  Filled 2018-12-28 (×4): qty 1

## 2018-12-28 MED ORDER — SODIUM CHLORIDE 0.9% FLUSH
3.0000 mL | Freq: Two times a day (BID) | INTRAVENOUS | Status: DC
  Administered 2018-12-28 – 2018-12-30 (×5): 3 mL via INTRAVENOUS

## 2018-12-28 MED ORDER — SODIUM CHLORIDE 0.9 % IV SOLN: 250.0000 mL | INTRAVENOUS | Status: DC | PRN

## 2018-12-28 MED FILL — Lidocaine HCl Local Soln Prefilled Syringe 100 MG/5ML (2%): INTRAMUSCULAR | Qty: 25 | Status: AC

## 2018-12-28 MED FILL — Sodium Bicarbonate IV Soln 8.4%: INTRAVENOUS | Qty: 50 | Status: AC

## 2018-12-28 MED FILL — Heparin Sodium (Porcine) Inj 1000 Unit/ML: INTRAMUSCULAR | Qty: 10 | Status: AC

## 2018-12-28 MED FILL — Electrolyte-R (PH 7.4) Solution: INTRAVENOUS | Qty: 3000 | Status: AC

## 2018-12-28 MED FILL — Mannitol IV Soln 20%: INTRAVENOUS | Qty: 1000 | Status: AC

## 2018-12-28 NOTE — Progress Notes (Addendum)
TCTS DAILY ICU PROGRESS NOTE                   [address]            [place] [id number]          [phone]   2 Days Post-Op Procedure(s) (LRB): MINIMALLY INVASIVE MITRAL VALVE REPAIR (MVR) using Memo 4D Ring Size 36mm (Right) TRANSESOPHAGEAL ECHOCARDIOGRAM (TEE) (N/A)  Total Length of Stay:  LOS: 2 days   Subjective: Sitting up having clear liquid breakfast, nausea resolved. Walked in unit yesterday. No new concerns.   Objective: Vital signs in last 24 hours: Temp:  [97 F (36.1 C)-98.9 F (37.2 C)] 98.7 F (37.1 C) (06/18 0400) Pulse Rate:  [62-95] 72 (06/18 0700) Cardiac Rhythm: Normal sinus rhythm (06/18 0400) Resp:  [10-22] 11 (06/18 0700) BP: (102-135)/(61-87) 135/87 (06/18 0700) SpO2:  [97 %-100 %] 97 % (06/18 0700) Arterial Line BP: (142)/(65) 142/65 (06/17 0800) Weight:  [84.7 kg] 84.7 kg (06/18 0600)  Filed Weights   12/26/18 0559 12/27/18 0600 12/28/18 0600  Weight: 88.5 kg 88.2 kg 84.7 kg    Weight change: -3.5 kg   Hemodynamic parameters for last 24 hours: PAP: (15)/(8) 15/8  Intake/Output from previous day: 06/17 0701 - 06/18 0700 In: 480.2 [P.O.:240; I.V.:40.3; IV Piggyback:199.9] Out: 3737 [Urine:3487; Chest Tube:250]  Intake/Output this shift: Net 3L O>I  Current Meds: Scheduled Meds: . acetaminophen  1,000 mg Oral Q6H  . aspirin EC  325 mg Oral Daily  . bisacodyl  10 mg Oral Daily   Or  . bisacodyl  10 mg Rectal Daily  . Chlorhexidine Gluconate Cloth  6 each Topical Daily  . docusate sodium  200 mg Oral Daily  . enoxaparin (LOVENOX) injection  30 mg Subcutaneous QHS  . furosemide  20 mg Intravenous BID  . insulin aspart  0-24 Units Subcutaneous Q4H  . latanoprost  1 drop Both Eyes QHS  . metoprolol tartrate  12.5 mg Oral BID  . pantoprazole  40 mg Oral Daily  . sodium chloride flush  10-40 mL Intracatheter Q12H  . sodium chloride flush  3 mL Intravenous Q12H  . timolol  1 drop Left Eye Daily  . warfarin  2.5 mg  Oral q1800  . Warfarin - Physician Dosing Inpatient   Does not apply q1800   Continuous Infusions: . sodium chloride    . lactated ringers    . lactated ringers Stopped (12/27/18 0837)  . nitroGLYCERIN Stopped (12/27/18 0810)   PRN Meds:.metoprolol tartrate, morphine injection, ondansetron (ZOFRAN) IV, oxyCODONE, sodium chloride flush, sodium chloride flush, traMADol  General appearance: alert, cooperative and mild distress Neurologic: intact Heart: regular rate and rhythm, S1, S2 normal, no murmur. Monitor shows few PVC's with a brief period of bigeminal PVC's.  Lungs: clear to auscultation bilaterally. CT drainage 150ml past 12 hours.  Abdomen: soft and nontender, active bowel sounds. Extremities: All well perfused, minimal edema.  Right groin vascular access sites are dry, no evidence of hematoma.  Wound: Right chest incision is covered with the Aquacel surgical dressing and is dry and intact.   Lab Results: CBC: Recent Labs    12/27/18 1630 12/28/18 0415  WBC 12.1* 10.3  HGB 12.7* 12.1*  HCT 36.6* 34.6*  PLT 192 161   BMET:  Recent Labs    12/27/18 1630 12/28/18 0415  NA 138 138  K 4.0 4.0  CL 104 103  CO2 27 30  GLUCOSE 115* 110*  BUN  16 15  CREATININE 0.88 1.05  CALCIUM 8.2* 8.2*    CMET: Lab Results  Component Value Date   WBC 10.3 12/28/2018   HGB 12.1 (L) 12/28/2018   HCT 34.6 (L) 12/28/2018   PLT 161 12/28/2018   GLUCOSE 110 (H) 12/28/2018   ALT 30 12/22/2018   AST 27 12/22/2018   NA 138 12/28/2018   K 4.0 12/28/2018   CL 103 12/28/2018   CREATININE 1.05 12/28/2018   BUN 15 12/28/2018   CO2 30 12/28/2018   INR 1.2 12/28/2018   HGBA1C 5.2 12/22/2018      PT/INR:  Recent Labs    12/28/18 0415  LABPROT 15.5*  INR 1.2   Radiology: No results found.   Assessment/Plan: S/P Procedure(s) (LRB): MINIMALLY INVASIVE MITRAL VALVE REPAIR (MVR) using Memo 4D Ring Size 40mm (Right) TRANSESOPHAGEAL ECHOCARDIOGRAM (TEE) (N/A)   -POD2 mini MV  repair. Hemodynamics and cardiac rhythm stable since surgery. NTG weaned off yesterday, making appropriate progress with mobility. Good response to diuresis. Warfarin 2.5mg  x 1 dose, INR 1.2. D/C Foley, leave CT's another day.   -Hypertension- NTG weaned off. Continue oral metoprolol   -Nausea-Resolved, advance diet.  -History of glaucoma- Timilol and Xalatan ophth gtts resumed.  -DVT PPX- continue SCD for now, add enoxaparin this evening.   Antony Odea, PA-C [phone] 12/28/2018 7:30 AM    I have seen and examined the patient and agree with the assessment and plan as outlined.  Doing very well POD2.  Transfer 4E  Rexene Alberts, MD 12/28/2018 8:37 AM

## 2018-12-28 NOTE — Progress Notes (Signed)
CARDIAC REHAB PHASE I   PRE:  Rate/Rhythm: 53 ? Wenkebach    BP: sitting 102/71    SaO2: 97 RA  MODE:  Ambulation: 470 ft   POST:  Rate/Rhythm: 67 ? wenkebach    BP: sitting 108/61     SaO2: 100 RA  Pt able to get out of bed independently and walk with RW. Steady. Only c/o is slight nausea. Rhythm appears to be wenkebach, did increase to 60s with walking. To recliner after walk. Able to do IS 2500 mL. Encouraged another walk. [place], ACSM 12/28/2018 2:53 PM

## 2018-12-28 NOTE — Progress Notes (Signed)
Patient arrived from [facility] after mini-MVR w/Owen. CHG bath completed.  Telemetry monitor applied and CCMD notified.  Patient oriented to unit and room to include call light and phone.  Will continue to monitor.

## 2018-12-29 DIAGNOSIS — I10 Essential (primary) hypertension: Secondary | ICD-10-CM | POA: Diagnosis not present

## 2018-12-29 DIAGNOSIS — I341 Nonrheumatic mitral (valve) prolapse: Secondary | ICD-10-CM | POA: Diagnosis not present

## 2018-12-29 DIAGNOSIS — H409 Unspecified glaucoma: Secondary | ICD-10-CM | POA: Diagnosis not present

## 2018-12-29 DIAGNOSIS — I4891 Unspecified atrial fibrillation: Secondary | ICD-10-CM | POA: Diagnosis not present

## 2018-12-29 LAB — BASIC METABOLIC PANEL
Anion gap: 10 (ref 5–15)
BUN: 20 mg/dL (ref 6–20)
CO2: 28 mmol/L (ref 22–32)
Calcium: 8.5 mg/dL — ABNORMAL LOW (ref 8.9–10.3)
Chloride: 100 mmol/L (ref 98–111)
Creatinine, Ser: 0.98 mg/dL (ref 0.61–1.24)
GFR calc Af Amer: >60 mL/min (ref >60)
GFR calc non Af Amer: >60 mL/min (ref >60)
Glucose, Bld: 116 mg/dL — ABNORMAL HIGH (ref 70–99)
Potassium: 3.9 mmol/L (ref 3.5–5.1)
Sodium: 138 mmol/L (ref 135–145)

## 2018-12-29 LAB — CBC
HCT: 37.9 % — ABNORMAL LOW (ref 39.0–52.0)
Hemoglobin: 12.9 g/dL — ABNORMAL LOW (ref 13.0–17.0)
MCH: 32.2 pg (ref 26.0–34.0)
MCHC: 34 g/dL (ref 30.0–36.0)
MCV: 94.5 fL (ref 80.0–100.0)
Platelets: 206 10*3/uL (ref 150–400)
RBC: 4.01 MIL/uL — ABNORMAL LOW (ref 4.22–5.81)
RDW: 12.4 % (ref 11.5–15.5)
WBC: 11.9 10*3/uL — ABNORMAL HIGH (ref 4.0–10.5)
nRBC: 0 % (ref 0.0–0.2)

## 2018-12-29 LAB — PROTIME-INR
INR: 1.1 (ref 0.8–1.2)
Prothrombin Time: 13.7 seconds (ref 11.4–15.2)

## 2018-12-29 MED ORDER — WARFARIN SODIUM 5 MG PO TABS
5.0000 mg | ORAL_TABLET | Freq: Every day | ORAL | Status: DC
  Administered 2018-12-29 – 2018-12-30 (×2): 5 mg via ORAL
  Filled 2018-12-29 (×2): qty 1

## 2018-12-29 NOTE — Progress Notes (Signed)
Epicardial pacing wires removed at 11:45 w/o difficulty. Patient tolerated well.   Wires intact.  No drainage.  Vital signs Q15 mins.  Bed rest initiated

## 2018-12-29 NOTE — Plan of Care (Signed)
  Problem: Education: Goal: Will demonstrate proper wound care and an understanding of methods to prevent future damage Outcome: Progressing Goal: Knowledge of disease or condition will improve Outcome: Progressing Goal: Knowledge of the prescribed therapeutic regimen will improve Outcome: Progressing Goal: Individualized Educational Video(s) Outcome: Progressing   Problem: Activity: Goal: Risk for activity intolerance will decrease Outcome: Progressing   Problem: Cardiac: Goal: Will achieve and/or maintain hemodynamic stability Outcome: Progressing   Problem: Clinical Measurements: Goal: Postoperative complications will be avoided or minimized Outcome: Progressing   Problem: Respiratory: Goal: Respiratory status will improve Outcome: Progressing   Problem: Skin Integrity: Goal: Wound healing without signs and symptoms of infection Outcome: Progressing Goal: Risk for impaired skin integrity will decrease Outcome: Progressing     

## 2018-12-29 NOTE — Progress Notes (Signed)
Patient ambulated in hallway about 900 feet with RN without using walker,with steady gait,denies pain or SOB.

## 2018-12-29 NOTE — Discharge Instructions (Signed)
Discharge Instructions:  1. You may shower, please wash incisions daily with soap and water and keep dry.  If you wish to cover wounds with dressing you may do so but please keep clean and change daily.  No tub baths or swimming until incisions have completely healed.  If your incisions become red or develop any drainage please call our office at [phone]  2. No Driving until cleared by Dr. Owen's office and you are no longer using narcotic pain medications  3. Monitor your weight daily.. Please use the same scale and weigh at same time... If you gain 5-10 lbs in 48 hours with associated lower extremity swelling, please contact our office at [phone]  4. Fever of 101.5 for at least 24 hours with no source, please contact our office at [phone]  5. Activity- up as tolerated, please walk at least 3 times per day.  Avoid strenuous activity, no lifting, pushing, or pulling with your arms over 8-10 lbs for a minimum of 6 weeks  6. If any questions or concerns arise, please do not hesitate to contact our office at [phone] Information on my medicine - Coumadin   (Warfarin)   Why was Coumadin prescribed for you? Coumadin was prescribed for you because you have a blood clot or a medical condition that can cause an increased risk of forming blood clots. Blood clots can cause serious health problems by blocking the flow of blood to the heart, lung, or brain. Coumadin can prevent harmful blood clots from forming. As a reminder your indication for Coumadin is:   Blood Clot Prevention After Heart Valve Surgery  What test will check on my response to Coumadin? While on Coumadin (warfarin) you will need to have an INR test regularly to ensure that your dose is keeping you in the desired range. The INR (international normalized ratio) number is calculated from the result of the laboratory test called prothrombin time (PT).  If an INR APPOINTMENT HAS NOT ALREADY BEEN MADE FOR YOU please  schedule an appointment to have this lab work done by your health care provider within 7 days. Your INR goal is usually a number between:  2 to 3 or your provider may give you a more narrow range like 2-2.5.  Ask your health care provider during an office visit what your goal INR is.  What  do you need to  know  About  COUMADIN? Take Coumadin (warfarin) exactly as prescribed by your healthcare provider about the same time each day.  DO NOT stop taking without talking to the doctor who prescribed the medication.  Stopping without other blood clot prevention medication to take the place of Coumadin may increase your risk of developing a new clot or stroke.  Get refills before you run out.  What do you do if you miss a dose? If you miss a dose, take it as soon as you remember on the same day then continue your regularly scheduled regimen the next day.  Do not take two doses of Coumadin at the same time.  Important Safety Information A possible side effect of Coumadin (Warfarin) is an increased risk of bleeding. You should call your healthcare provider right away if you experience any of the following: ? Bleeding from an injury or your nose that does not stop. ? Unusual colored urine (red or dark brown) or unusual colored stools (red or black). ? Unusual bruising for unknown reasons. ? A serious fall or if you hit your head (even if   there is no bleeding).  Some foods or medicines interact with Coumadin (warfarin) and might alter your response to warfarin. To help avoid this: ? Eat a balanced diet, maintaining a consistent amount of Vitamin K. ? Notify your provider about major diet changes you plan to make. ? Avoid alcohol or limit your intake to 1 drink for women and 2 drinks for men per day. (1 drink is 5 oz. wine, 12 oz. beer, or 1.5 oz. liquor.)  Make sure that ANY health care provider who prescribes medication for you knows that you are taking Coumadin (warfarin).  Also make sure the  healthcare provider who is monitoring your Coumadin knows when you have started a new medication including herbals and non-prescription products.  Coumadin (Warfarin)  Major Drug Interactions  Increased Warfarin Effect Decreased Warfarin Effect  Alcohol (large quantities) Antibiotics (esp. Septra/Bactrim, Flagyl, Cipro) Amiodarone (Cordarone) Aspirin (ASA) Cimetidine (Tagamet) Megestrol (Megace) NSAIDs (ibuprofen, naproxen, etc.) Piroxicam (Feldene) Propafenone (Rythmol SR) Propranolol (Inderal) Isoniazid (INH) Posaconazole (Noxafil) Barbiturates (Phenobarbital) Carbamazepine (Tegretol) Chlordiazepoxide (Librium) Cholestyramine (Questran) Griseofulvin Oral Contraceptives Rifampin Sucralfate (Carafate) Vitamin K   Coumadin (Warfarin) Major Herbal Interactions  Increased Warfarin Effect Decreased Warfarin Effect  Garlic Ginseng Ginkgo biloba Coenzyme Q10 Green tea St. John's wort    Coumadin (Warfarin) FOOD Interactions  Eat a consistent number of servings per week of foods HIGH in Vitamin K (1 serving =  cup)  Collards (cooked, or boiled & drained) Kale (cooked, or boiled & drained) Mustard greens (cooked, or boiled & drained) Parsley *serving size only =  cup Spinach (cooked, or boiled & drained) Swiss chard (cooked, or boiled & drained) Turnip greens (cooked, or boiled & drained)  Eat a consistent number of servings per week of foods MEDIUM-HIGH in Vitamin K (1 serving = 1 cup)  Asparagus (cooked, or boiled & drained) Broccoli (cooked, boiled & drained, or raw & chopped) Brussel sprouts (cooked, or boiled & drained) *serving size only =  cup Lettuce, raw (green leaf, endive, romaine) Spinach, raw Turnip greens, raw & chopped   These websites have more information on Coumadin (warfarin):  www.coumadin.com; www.ahrq.gov/consumer/coumadin.htm;    

## 2018-12-29 NOTE — Progress Notes (Signed)
Chest tubes removed without difficulty.  Sutures tightened.  No drainage noted from site. Patient tolerated well.  No difficulty breathing.  Will continue to observe.

## 2018-12-29 NOTE — Progress Notes (Signed)
CARDIAC REHAB PHASE I   PRE:  Rate/Rhythm: 79 Afib?  BP:  Sitting: 91/69      SaO2: 98 RA  MODE:  Ambulation: 470 ft   POST:  Rate/Rhythm: 105 Afib with PVCs  BP:  Sitting: 110/76    SaO2: 99 RA   Pt ambulated 430ft in hallway standby assist with front wheel walker. Pt with steady gait. Pt denies pain, SOB, and dizziness. Encouraged continued walks and IS use. Will continue to follow.  [phone] Rufina Falco, RN BSN 12/29/2018 10:25 AM

## 2018-12-29 NOTE — Progress Notes (Addendum)
3 Days Post-Op Procedure(s) (LRB): MINIMALLY INVASIVE MITRAL VALVE REPAIR (MVR) using Memo 4D Ring Size 16mm (Right) TRANSESOPHAGEAL ECHOCARDIOGRAM (TEE) (N/A) Subjective: Feels pretty well, having some mild nausea, no vomitiing. He has had a few loose stools, voiding OK.    Objective: Vital signs in last 24 hours: Temp:  [98 F (36.7 C)-99.3 F (37.4 C)] 99.3 F (37.4 C) (06/19 0820) Pulse Rate:  [55-98] 86 (06/19 0820) Cardiac Rhythm: Junctional rhythm (06/19 0700) Resp:  [11-33] 15 (06/19 0820) BP: (92-116)/(62-79) 105/71 (06/19 0820) SpO2:  [95 %-100 %] 99 % (06/19 0820) Weight:  [83.7 kg-87.7 kg] 83.7 kg (06/19 0400)  Hemodynamic parameters for last 24 hours:    Intake/Output from previous day: 06/18 0701 - 06/19 0700 In: 720 [P.O.:720] Out: 1480 [Urine:1350; Chest Tube:130] Intake/Output this shift: No intake/output data recorded.  General appearance:alert, cooperative and no distress Neurologic:intact Heart:irregularly irregular rhythm. Rate in the 80-90's, no murmur.  Lungs:clear to auscultation bilaterally. CT drainage 20ml past 12 hours. Abdomen:soft and nontender, active bowel sounds. Extremities:All well perfused, minimal edema.Right groin vascular access sites are dry, no evidence of hematoma. Wound:Right chest incision is covered with the Aquacel surgical dressing and is dry and intact.  Lab Results: Recent Labs    12/28/18 0415 12/29/18 0504  WBC 10.3 11.9*  HGB 12.1* 12.9*  HCT 34.6* 37.9*  PLT 161 206   BMET:  Recent Labs    12/28/18 0415 12/29/18 0504  NA 138 138  K 4.0 3.9  CL 103 100  CO2 30 28  GLUCOSE 110* 116*  BUN 15 20  CREATININE 1.05 0.98  CALCIUM 8.2* 8.5*    PT/INR:  Recent Labs    12/29/18 0504  LABPROT 13.7  INR 1.1   ABG    Component Value Date/Time   PHART 7.356 12/26/2018 1214   HCO3 24.9 12/26/2018 1329   TCO2 24 12/26/2018 1917   ACIDBASEDEF 2.0 12/26/2018 1329   O2SAT 98.0 12/26/2018 1329    CBG (last 3)  Recent Labs    12/27/18 2353 12/28/18 0418 12/28/18 0758  GLUCAP 99 98 175*    Assessment/Plan: S/P Procedure(s) (LRB): MINIMALLY INVASIVE MITRAL VALVE REPAIR (MVR) using Memo 4D Ring Size 39mm (Right) TRANSESOPHAGEAL ECHOCARDIOGRAM (TEE) (N/A)   -POD3 mini MV repair. Hemodynamics and cardiac rhythm stable since surgery. Good response to diuresis. Warfarin 2.5mg  x 2 dose, INR 1.1.  Coumadin 5mg  po tonight.  D/C OnQ catheter,  leave CT's for now and possibly d/c later today.    -Appears to be having intermittent afib. Rate controlled on metoprolol now.   -Hypertension- BP accepteble. Continue oral metoprolol   -Nausea-Mild, abd exam benign. Allow diet as tolerated.   -History of glaucoma- Timilol and Xalatan ophth gtts resumed.  -DVT PPX- continue enoxaparin.   Antony Odea, PA-C [phone] 12/29/2018 7:30 AM    LOS: 3 days   I have seen and examined the patient and agree with the assessment and plan as outlined.  Went into rate-controlled Afib overnight.  Continue low dose metoprolol and consider adding amiodarone if Afib persists.  Rexene Alberts, MD 12/29/2018 1:00 PM

## 2018-12-30 ENCOUNTER — Inpatient Hospital Stay (HOSPITAL_COMMUNITY): Payer: Federal, State, Local not specified - PPO

## 2018-12-30 DIAGNOSIS — J9383 Other pneumothorax: Secondary | ICD-10-CM | POA: Diagnosis not present

## 2018-12-30 LAB — CBC
HCT: 38.2 % — ABNORMAL LOW (ref 39.0–52.0)
Hemoglobin: 12.9 g/dL — ABNORMAL LOW (ref 13.0–17.0)
MCH: 31.6 pg (ref 26.0–34.0)
MCHC: 33.8 g/dL (ref 30.0–36.0)
MCV: 93.6 fL (ref 80.0–100.0)
Platelets: 210 10*3/uL (ref 150–400)
RBC: 4.08 MIL/uL — ABNORMAL LOW (ref 4.22–5.81)
RDW: 12.3 % (ref 11.5–15.5)
WBC: 8.5 10*3/uL (ref 4.0–10.5)
nRBC: 0 % (ref 0.0–0.2)

## 2018-12-30 LAB — BASIC METABOLIC PANEL
Anion gap: 10 (ref 5–15)
BUN: 20 mg/dL (ref 6–20)
CO2: 28 mmol/L (ref 22–32)
Calcium: 8.7 mg/dL — ABNORMAL LOW (ref 8.9–10.3)
Chloride: 100 mmol/L (ref 98–111)
Creatinine, Ser: 0.96 mg/dL (ref 0.61–1.24)
GFR calc Af Amer: >60 mL/min (ref >60)
GFR calc non Af Amer: >60 mL/min (ref >60)
Glucose, Bld: 114 mg/dL — ABNORMAL HIGH (ref 70–99)
Potassium: 4.3 mmol/L (ref 3.5–5.1)
Sodium: 138 mmol/L (ref 135–145)

## 2018-12-30 LAB — PROTIME-INR
INR: 1.2 (ref 0.8–1.2)
Prothrombin Time: 15.1 seconds (ref 11.4–15.2)

## 2018-12-30 NOTE — Progress Notes (Addendum)
[address]       [place] [address]             [phone]      4 Days Post-Op Procedure(s) (LRB): MINIMALLY INVASIVE MITRAL VALVE REPAIR (MVR) using Memo 4D Ring Size 3mm (Right) TRANSESOPHAGEAL ECHOCARDIOGRAM (TEE) (N/A) Subjective: C/o some intermit left shoulder pain  Objective: Vital signs in last 24 hours: Temp:  [98.1 F (36.7 C)-98.9 F (37.2 C)] 98.1 F (36.7 C) (06/20 0912) Pulse Rate:  [48-96] 96 (06/20 0912) Cardiac Rhythm: Normal sinus rhythm (06/20 0700) Resp:  [15-74] 21 (06/20 0912) BP: (82-122)/(62-76) 82/62 (06/20 0912) SpO2:  [95 %-99 %] 98 % (06/20 0912) Weight:  [83.7 kg] 83.7 kg (06/20 0526)  Hemodynamic parameters for last 24 hours:    Intake/Output from previous day: 06/19 0701 - 06/20 0700 In: 350 [P.O.:350] Out: -  Intake/Output this shift: Total I/O In: 175 [P.O.:175] Out: -   General appearance: alert, cooperative and no distress Heart: regular rate and rhythm and no murmur Lungs: clear to auscultation bilaterally Abdomen: benign Extremities: no edema Wound: incis healing well  Lab Results: Recent Labs    12/29/18 0504 12/30/18 0754  WBC 11.9* 8.5  HGB 12.9* 12.9*  HCT 37.9* 38.2*  PLT 206 210   BMET:  Recent Labs    12/29/18 0504 12/30/18 0754  NA 138 138  K 3.9 4.3  CL 100 100  CO2 28 28  GLUCOSE 116* 114*  BUN 20 20  CREATININE 0.98 0.96  CALCIUM 8.5* 8.7*    PT/INR:  Recent Labs    12/30/18 0754  LABPROT 15.1  INR 1.2   ABG    Component Value Date/Time   PHART 7.356 12/26/2018 1214   HCO3 24.9 12/26/2018 1329   TCO2 24 12/26/2018 1917   ACIDBASEDEF 2.0 12/26/2018 1329   O2SAT 98.0 12/26/2018 1329   CBG (last 3)  Recent Labs    12/27/18 2353 12/28/18 0418 12/28/18 0758  GLUCAP 99 98 175*    Meds Scheduled Meds: . acetaminophen  1,000 mg Oral Q6H  . aspirin EC  81 mg Oral Daily  . bisacodyl  10 mg Oral Daily   Or  . bisacodyl  10 mg Rectal Daily  .  Chlorhexidine Gluconate Cloth  6 each Topical Daily  . docusate sodium  200 mg Oral Daily  . enoxaparin (LOVENOX) injection  30 mg Subcutaneous QHS  . latanoprost  1 drop Both Eyes QHS  . metoprolol tartrate  12.5 mg Oral BID  . pantoprazole  40 mg Oral Daily  . sodium chloride flush  10-40 mL Intracatheter Q12H  . sodium chloride flush  3 mL Intravenous Q12H  . timolol  1 drop Left Eye Daily  . warfarin  5 mg Oral q1800  . Warfarin - Physician Dosing Inpatient   Does not apply q1800   Continuous Infusions: . sodium chloride     PRN Meds:.sodium chloride, metoprolol tartrate, morphine injection, ondansetron (ZOFRAN) IV, oxyCODONE, sodium chloride flush, sodium chloride flush, traMADol  Xrays No results found.  Assessment/Plan: S/P Procedure(s) (LRB): MINIMALLY INVASIVE MITRAL VALVE REPAIR (MVR) using Memo 4D Ring Size 5mm (Right) TRANSESOPHAGEAL ECHOCARDIOGRAM (TEE) (N/A)   1 doing well POD#3 2 BP runs relatively low, on low dose beta blocker (lopressor 12.5 bid)- will hold for SBP <671 3 uncertain about cardiac rhythm,? Junctional v AV block-  Doesn't appear to be afib,  rate is controlled, observe for now.  4 renal fxn  is normal, doesn't appear to have any clinical volume overload, weight below preop 5 H/H stable essentially not anemic 6 minor leukocytosis resolved 7 BS controlled 8 shoulder pain, + H/O bursitis, prob positional, normal coronaries, no sternotomy 9 cont routine rehab/pulm toilet  LOS: 4 days    Rowe ClackWayne E Gold [facility] 12/30/2018 Pager [phone]   I have seen and examined the patient and agree with the assessment and plan as outlined.  Possibly ready for d/c home 1-2 days  Purcell Nailslarence H Owen, MD 12/30/2018 10:46 AM

## 2018-12-30 NOTE — Progress Notes (Signed)
CARDIAC REHAB PHASE I   PRE:  Rate/Rhythm: 86 Afib?  BP:  Sitting: 99/69        SaO2: 100% Ra   MODE:  Ambulation: 790  ft   POST:  Rate/Rhythm: 95 Afib?  BP:  Sitting: 113/77       SaO2: 100 % RA  Pt ambulated well independently 790 ft. Pt denied SOB or CP. Pt was educated on incision care, exercise guidelines, IS use, nutrition, CRP II, and restrictions. Pt was very receptive to information. Will refer Pt to CRP II [place]. Pt to chair with call bell and phone within reach.   [phone]  Jeralyn Bennett BS, ACSM CEP 12/30/2018  10:45 AM

## 2018-12-31 ENCOUNTER — Inpatient Hospital Stay (HOSPITAL_COMMUNITY): Payer: Federal, State, Local not specified - PPO

## 2018-12-31 DIAGNOSIS — J90 Pleural effusion, not elsewhere classified: Secondary | ICD-10-CM | POA: Diagnosis not present

## 2018-12-31 DIAGNOSIS — J9811 Atelectasis: Secondary | ICD-10-CM | POA: Diagnosis not present

## 2018-12-31 DIAGNOSIS — J939 Pneumothorax, unspecified: Secondary | ICD-10-CM | POA: Diagnosis not present

## 2018-12-31 LAB — PROTIME-INR
INR: 1.2 (ref 0.8–1.2)
Prothrombin Time: 15.1 seconds (ref 11.4–15.2)

## 2018-12-31 MED ORDER — AMIODARONE HCL 200 MG PO TABS: 200.0000 mg | ORAL_TABLET | Freq: Two times a day (BID) | ORAL | 1 refills | Status: DC

## 2018-12-31 MED ORDER — METOPROLOL TARTRATE 25 MG PO TABS: 12.5000 mg | ORAL_TABLET | Freq: Two times a day (BID) | ORAL | 1 refills | Status: DC

## 2018-12-31 MED ORDER — ASPIRIN 81 MG PO TBEC: 81.0000 mg | DELAYED_RELEASE_TABLET | Freq: Every day | ORAL | Status: DC

## 2018-12-31 MED ORDER — OXYCODONE HCL 5 MG PO TABS: 5.0000 mg | ORAL_TABLET | Freq: Four times a day (QID) | ORAL | 0 refills | Status: AC | PRN

## 2018-12-31 MED ORDER — WARFARIN SODIUM 5 MG PO TABS: 5.0000 mg | ORAL_TABLET | Freq: Every day | ORAL | 1 refills | Status: DC

## 2018-12-31 MED ORDER — AMIODARONE HCL 200 MG PO TABS: 200.0000 mg | ORAL_TABLET | Freq: Two times a day (BID) | ORAL | Status: DC

## 2018-12-31 NOTE — Progress Notes (Addendum)
[address]       [place] [id number]             [phone]      5 Days Post-Op Procedure(s) (LRB): MINIMALLY INVASIVE MITRAL VALVE REPAIR (MVR) using Memo 4D Ring Size 36mm (Right) TRANSESOPHAGEAL ECHOCARDIOGRAM (TEE) (N/A) Subjective: Feels pretty well   Objective: Vital signs in last 24 hours: Temp:  [97.7 F (36.5 C)-98.6 F (37 C)] 98.2 F (36.8 C) (06/21 0835) Pulse Rate:  [79-95] 95 (06/21 0835) Cardiac Rhythm: Heart block (06/21 0700) Resp:  [13-19] 19 (06/21 0835) BP: (82-122)/(61-106) 122/106 (06/21 0835) SpO2:  [96 %-100 %] 96 % (06/21 0835) Weight:  [83.4 kg] 83.4 kg (06/21 0445)  Hemodynamic parameters for last 24 hours:    Intake/Output from previous day: 06/20 0701 - 06/21 0700 In: 575 [P.O.:575] Out: -  Intake/Output this shift: No intake/output data recorded.  General appearance: alert, cooperative and no distress Heart: regular rate and rhythm and no murmur, occ irreg beat Lungs: clear, + subQ air on right Abdomen: benign Extremities: no edema Wound: incis healing well  Lab Results: Recent Labs    12/29/18 0504 12/30/18 0754  WBC 11.9* 8.5  HGB 12.9* 12.9*  HCT 37.9* 38.2*  PLT 206 210   BMET:  Recent Labs    12/29/18 0504 12/30/18 0754  NA 138 138  K 3.9 4.3  CL 100 100  CO2 28 28  GLUCOSE 116* 114*  BUN 20 20  CREATININE 0.98 0.96  CALCIUM 8.5* 8.7*    PT/INR:  Recent Labs    12/31/18 0405  LABPROT 15.1  INR 1.2   ABG    Component Value Date/Time   PHART 7.356 12/26/2018 1214   HCO3 24.9 12/26/2018 1329   TCO2 24 12/26/2018 1917   ACIDBASEDEF 2.0 12/26/2018 1329   O2SAT 98.0 12/26/2018 1329   CBG (last 3)  No results for input(s): GLUCAP in the last 72 hours.  Meds Scheduled Meds: . acetaminophen  1,000 mg Oral Q6H  . aspirin EC  81 mg Oral Daily  . bisacodyl  10 mg Oral Daily   Or  . bisacodyl  10 mg Rectal Daily  . Chlorhexidine Gluconate Cloth  6 each Topical Daily  .  docusate sodium  200 mg Oral Daily  . enoxaparin (LOVENOX) injection  30 mg Subcutaneous QHS  . latanoprost  1 drop Both Eyes QHS  . metoprolol tartrate  12.5 mg Oral BID  . pantoprazole  40 mg Oral Daily  . sodium chloride flush  10-40 mL Intracatheter Q12H  . sodium chloride flush  3 mL Intravenous Q12H  . timolol  1 drop Left Eye Daily  . warfarin  5 mg Oral q1800  . Warfarin - Physician Dosing Inpatient   Does not apply q1800   Continuous Infusions: . sodium chloride     PRN Meds:.sodium chloride, metoprolol tartrate, morphine injection, ondansetron (ZOFRAN) IV, oxyCODONE, sodium chloride flush, sodium chloride flush, traMADol  Xrays Dg Chest 2 View  Result Date: 12/31/2018 CLINICAL DATA:  Pneumothorax EXAM: CHEST - 2 VIEW COMPARISON:  Chest radiograph from one day prior. FINDINGS: Stable cardiomediastinal silhouette with normal heart size. Small right apical pneumothorax is slightly decreased. No left pneumothorax. Stable tiny bilateral basilar pleural effusions. No pulmonary edema. Stable mild right basilar atelectasis. Stable subcutaneous emphysema throughout the lateral right chest wall and lower right neck. IMPRESSION: 1. Small right apical pneumothorax, slightly decreased. 2. Stable tiny bilateral basilar pleural effusions.  3. Stable mild right basilar atelectasis. Electronically Signed   By: Ilona Sorrel M.D.   On: 12/31/2018 07:27   Dg Chest 2 View  Result Date: 12/30/2018 CLINICAL DATA:  Status post mitral valve repair. EXAM: CHEST - 2 VIEW COMPARISON:  12/28/2018 FINDINGS: RIGHT thoracostomy and mediastinal tubes have been removed. A small (less than 5%) RIGHT apical pneumothorax and moderate to large amount of RIGHT subcutaneous emphysema is now noted. There is no evidence of LEFT lung abnormality. Cardiomediastinal silhouette is unchanged with mitral valve replacement. IMPRESSION: RIGHT thoracostomy and mediastinal tube removed. Small RIGHT apical pneumothorax and moderate to  large amount of RIGHT subcutaneous emphysema. These results will be called to the ordering clinician or representative by the Radiologist Assistant, and communication documented in the PACS or zVision Dashboard. Electronically Signed   By: Margarette Canada M.D.   On: 12/30/2018 14:31    Assessment/Plan: S/P Procedure(s) (LRB): MINIMALLY INVASIVE MITRAL VALVE REPAIR (MVR) using Memo 4D Ring Size 34mm (Right) TRANSESOPHAGEAL ECHOCARDIOGRAM (TEE) (N/A)  1 hemodyn stable with occas SBP into 80's- not symptomatic, rhythm unchanged 2 sats good on RA 3 CXR with tiny apical pntx on right , small effus, + Sub q air 4 INR 1.2- conts 5 mg coumadin, may need to increase 5 BS controlled 6 no other new labs 7 does not appear volume overloaded 8 will discuss with Dr Roxy Manns, may be ready for D/C today  LOS: 5 days    [facility] 12/31/2018 Pager [phone]  I have seen and examined the patient and agree with the assessment and plan as outlined.  Mr Congrove remains in rate-controlled Afib.  Will start low dose amiodarone.  Okay for D/C home today.  Rexene Alberts, MD 12/31/2018 11:19 AM

## 2018-12-31 NOTE — Progress Notes (Signed)
Pt d/c home. Pt is alert and oriented with no new concerns. D/C instructions done with teach back, pt verbalize understanding. Pt will be transported out of hospital by significant other

## 2019-01-03 ENCOUNTER — Telehealth: Payer: Self-pay

## 2019-01-03 ENCOUNTER — Ambulatory Visit (INDEPENDENT_AMBULATORY_CARE_PROVIDER_SITE_OTHER): Payer: Federal, State, Local not specified - PPO | Admitting: Cardiology

## 2019-01-03 ENCOUNTER — Other Ambulatory Visit: Payer: Self-pay

## 2019-01-03 DIAGNOSIS — Z7901 Long term (current) use of anticoagulants: Secondary | ICD-10-CM | POA: Diagnosis not present

## 2019-01-03 DIAGNOSIS — Z5181 Encounter for therapeutic drug level monitoring: Secondary | ICD-10-CM

## 2019-01-03 DIAGNOSIS — I341 Nonrheumatic mitral (valve) prolapse: Secondary | ICD-10-CM | POA: Diagnosis not present

## 2019-01-03 LAB — POCT INR: INR: 1.5 — AB (ref 2.0–3.0)

## 2019-01-03 MED ORDER — WARFARIN SODIUM 2.5 MG PO TABS: 2.5000 mg | ORAL_TABLET | ORAL | 3 refills | Status: DC

## 2019-01-03 NOTE — Telephone Encounter (Signed)
What is the range for his inr suppose to be today?

## 2019-01-03 NOTE — Telephone Encounter (Signed)
Believe is 2-3. Will double check with JG

## 2019-01-04 NOTE — Progress Notes (Signed)
INR subtherapeutic.  We'll change Coumadin to 7.5 mg every Wednesday and Friday and 5 mg other days.  Presently taking 5 mg daily.  Recheck in 2 weeks.

## 2019-01-16 ENCOUNTER — Ambulatory Visit: Payer: Federal, State, Local not specified - PPO | Admitting: Cardiology

## 2019-01-16 ENCOUNTER — Encounter: Payer: Self-pay | Admitting: Cardiology

## 2019-01-16 ENCOUNTER — Other Ambulatory Visit: Payer: Self-pay

## 2019-01-16 VITALS — BP 114/77 | HR 72 | Ht 72.0 in | Wt 184.6 lb

## 2019-01-16 DIAGNOSIS — I341 Nonrheumatic mitral (valve) prolapse: Secondary | ICD-10-CM | POA: Diagnosis not present

## 2019-01-16 DIAGNOSIS — R0609 Other forms of dyspnea: Secondary | ICD-10-CM

## 2019-01-16 DIAGNOSIS — Z298 Encounter for other specified prophylactic measures: Secondary | ICD-10-CM | POA: Diagnosis not present

## 2019-01-16 DIAGNOSIS — I480 Paroxysmal atrial fibrillation: Secondary | ICD-10-CM

## 2019-01-16 DIAGNOSIS — Z7901 Long term (current) use of anticoagulants: Secondary | ICD-10-CM

## 2019-01-16 DIAGNOSIS — Z9889 Other specified postprocedural states: Secondary | ICD-10-CM

## 2019-01-16 DIAGNOSIS — R0600 Dyspnea, unspecified: Secondary | ICD-10-CM

## 2019-01-16 LAB — POCT INR: INR: 1.7 — AB (ref 2.0–3.0)

## 2019-01-16 MED ORDER — AMIODARONE HCL 200 MG PO TABS: 200.0000 mg | ORAL_TABLET | Freq: Every day | ORAL | 1 refills | Status: DC

## 2019-01-16 NOTE — Progress Notes (Signed)
Primary Physician/Referring:  Pearson GrippeKim, James, MD  Patient ID: Derrick DameWallace E Hochstetler Jr., male    DOB: [birth year], 58 y.o.   MRN: [id number]  Chief Complaint  Patient presents with  . Mitral Valve Prolapse  . Follow-up   HPI: Derrick DameWallace E Croson Jr.  is a 58 y.o. male  Caucasian male with history of mitral valve prolapse and moderate to severe MR, had become symptomatic with dyspnea and underwent minimally invasive mitral valve repair on 12/26/2018 by Dr. Tressie Stalkerlarence Owen, no presents for follow-up.  He had postop A. fib and was started on Coumadin and as it was rate controlled he was discharged home with outpatient follow-up.  We have been monitoring his Coumadin and INR.  States that he has noticed marked improvement in his dyspnea.  He has not had any complications.  He is scheduled to see Dr. Cornelius Moraswen in 1 week to 10 days.  Past Medical History:  Diagnosis Date  . Glaucoma   . Heart valve problem   . History of kidney stones   . MVP (mitral valve prolapse)   . S/P minimally-invasive mitral valve repair 12/26/2018   Complex valvuloplasty including artificial Gore-tex neochord placement x10 and 36 mm Sorin Memo 4D ring annuloplasty via right mini thoracotomy approach  . Severe mitral valve regurgitation     Past Surgical History:  Procedure Laterality Date  . MITRAL VALVE REPAIR Right 12/26/2018   Procedure: MINIMALLY INVASIVE MITRAL VALVE REPAIR (MVR) using Memo 4D Ring Size 36mm;  Surgeon: Purcell Nailswen, Clarence H, MD;  [facility];  Service: Open Heart Surgery;  Laterality: Right;  . RIGHT/LEFT HEART CATH AND CORONARY ANGIOGRAPHY N/A 09/05/2018   Procedure: RIGHT/LEFT HEART CATH AND CORONARY ANGIOGRAPHY;  Surgeon: Elder NegusPatwardhan, Manish J, MD;  Location: [facility] INVASIVE CV LAB;  Service: Cardiovascular;  Laterality: N/A;  . TEE WITHOUT CARDIOVERSION N/A 09/05/2018   Procedure: TRANSESOPHAGEAL ECHOCARDIOGRAM (TEE);  Surgeon: Yates DecampGanji, Clifton Safley, MD;  [facility];  Service: Cardiovascular;  Laterality: N/A;   cath to follow  . TEE WITHOUT CARDIOVERSION N/A 12/26/2018   Procedure: TRANSESOPHAGEAL ECHOCARDIOGRAM (TEE);  Surgeon: Purcell Nailswen, Clarence H, MD;  [facility];  Service: Open Heart Surgery;  Laterality: N/A;  . TONSILLECTOMY      Social History   Socioeconomic History  . Marital status: Single    Spouse name: Not on file  . Number of children: 0  . Years of education: Not on file  . Highest education level: Not on file  Occupational History  . Not on file  Social Needs  . Financial resource strain: Not on file  . Food insecurity    Worry: Not on file    Inability: Not on file  . Transportation needs    Medical: Not on file    Non-medical: Not on file  Tobacco Use  . Smoking status: Never Smoker  . Smokeless tobacco: Never Used  Substance and Sexual Activity  . Alcohol use: No  . Drug use: Never  . Sexual activity: Not on file  Lifestyle  . Physical activity    Days per week: Not on file    Minutes per session: Not on file  . Stress: Not on file  Relationships  . Social Musicianconnections    Talks on phone: Not on file    Gets together: Not on file    Attends religious service: Not on file    Active member of club or organization: Not on file    Attends meetings of clubs or organizations: Not  on file    Relationship status: Not on file  . Intimate partner violence    Fear of current or ex partner: Not on file    Emotionally abused: Not on file    Physically abused: Not on file    Forced sexual activity: Not on file  Other Topics Concern  . Not on file  Social History Narrative  . Not on file   Review of Systems  Constitution: Negative for chills, decreased appetite, malaise/fatigue and weight gain.  Cardiovascular: Negative for dyspnea on exertion, leg swelling and syncope.  Endocrine: Negative for cold intolerance.  Hematologic/Lymphatic: Does not bruise/bleed easily.  Musculoskeletal: Negative for joint swelling.  Gastrointestinal: Negative for abdominal pain,  anorexia, change in bowel habit, hematochezia and melena.  Neurological: Negative for headaches and light-headedness.  Psychiatric/Behavioral: Negative for depression and substance abuse.  All other systems reviewed and are negative.  Objective  Blood pressure 114/77, pulse 72, height 6' (1.829 m), weight 184 lb 9.6 oz (83.7 kg), SpO2 99 %. Body mass index is 25.04 kg/m.    Physical Exam  Constitutional: He appears well-developed and well-nourished. No distress.  HENT:  Head: Atraumatic.  Eyes: Conjunctivae are normal.  Neck: Neck supple. No JVD present. No thyromegaly present.  Cardiovascular: Normal rate, regular rhythm, normal heart sounds and intact distal pulses. Exam reveals no gallop.  No murmur heard. Pulmonary/Chest: Effort normal and breath sounds normal.  Right lateral  minithoracotomy scar noted, sutures present, appears healthy.  Abdominal: Soft. Bowel sounds are normal.  Musculoskeletal: Normal range of motion.  Neurological: He is alert.  Skin: Skin is warm and dry.  Psychiatric: He has a normal mood and affect.   Radiology: No results found.  Laboratory examination:   CMP Latest Ref Rng & Units 12/30/2018 12/29/2018 12/28/2018  Glucose 70 - 99 mg/dL 114(H) 116(H) 110(H)  BUN 6 - 20 mg/dL 20 20 15   Creatinine 0.61 - 1.24 mg/dL 0.96 0.98 1.05  Sodium 135 - 145 mmol/L 138 138 138  Potassium 3.5 - 5.1 mmol/L 4.3 3.9 4.0  Chloride 98 - 111 mmol/L 100 100 103  CO2 22 - 32 mmol/L 28 28 30   Calcium 8.9 - 10.3 mg/dL 8.7(L) 8.5(L) 8.2(L)  Total Protein 6.5 - 8.1 g/dL - - -  Total Bilirubin 0.3 - 1.2 mg/dL - - -  Alkaline Phos 38 - 126 U/L - - -  AST 15 - 41 U/L - - -  ALT 0 - 44 U/L - - -   CBC Latest Ref Rng & Units 12/30/2018 12/29/2018 12/28/2018  WBC 4.0 - 10.5 K/uL 8.5 11.9(H) 10.3  Hemoglobin 13.0 - 17.0 g/dL 12.9(L) 12.9(L) 12.1(L)  Hematocrit 39.0 - 52.0 % 38.2(L) 37.9(L) 34.6(L)  Platelets 150 - 400 K/uL 210 206 161   Lipid Panel  No results found for:  CHOL, TRIG, HDL, CHOLHDL, VLDL, LDLCALC, LDLDIRECT HEMOGLOBIN A1C Lab Results  Component Value Date   HGBA1C 5.2 12/22/2018   MPG 102.54 12/22/2018   TSH No results for input(s): TSH in the last 8760 hours.   Medications   Current Outpatient Medications  Medication Instructions  . amiodarone (PACERONE) 200 mg, Oral, Daily  . aspirin 81 mg, Oral, Daily  . latanoprost (XALATAN) 0.005 % ophthalmic solution 1 drop, Daily at bedtime  . metoprolol tartrate (LOPRESSOR) 12.5 mg, Oral, 2 times daily  . timolol (TIMOPTIC) 0.5 % ophthalmic solution 1 drop, Left Eye, Daily  . warfarin (COUMADIN) 5 mg, Oral, Daily-1800, As directed by coumadin clinic  .  warfarin (COUMADIN) 2.5 mg, Oral, 2 times weekly    Cardiac Studies:   Right and left heart catheterization 09/05/2018: Normal coronary arteries with superdominant right coronary artery No coronary artery disease Normal filling pressures. Recommendation: Follow up with CVTS re: management of severe mitral regrgitation.  Carotid artery duplex 12/22/2018: Normal carotid artery study.  Assessment   S/P minimally-invasive mitral valve repair - 12/26/2018: Complex valvuloplasty including artificial Gore-tex neochord placement x10. Sorin Chief Executive OfficerMemo 4D Ring Annuloplasty (size 36mm) - Plan: POCT INR, PCV ECHOCARDIOGRAM COMPLETE,   Paroxysmal atrial fibrillation (HCC) - CHA2DS2-VASc Score is 1.  Yearly risk of stroke: 1.3%.  - Plan: EKG 12-Lead, PCV ECHOCARDIOGRAM COMPLETE,   Dyspnea on exertion   Indication present for endocarditis prophylaxis   Mitral valve prolapse    EKG 01/16/2019: Normal sinus rhythm at rate of 66 bpm, Left atrial enlargement,normal axis, Nonspecific inferior T abnormalities.  Normal QT interval.  Recommendations:   Patient is S/P minimally invasive mitral repair on 12/26/2018 and postop A. Fib. Will increase Coumadin dosage 5 mg daily and Wednesday and Friday 7.5 mg daily to 7.5 mg 5 times a week as I am also decreasing  amiodarone from 400 mg daily to 200 mg daily.  Patient is back in sinus rhythm.  Dyspnea has improved, no clinical evidence of heart failure, his right thoracotomy scar has healed well still has sutures in place.  Will decrease amiodarone from 40 mg daily to 12 mg daily.  We will schedule him for echocardiogram to establish a baseline.  He is aware that he needs endocarditis prophylaxis.  I would like to see him back in 6 weeks for follow-up.  I will discontinue warfarin at follow-up.  Yates DecampJay Devanta Daniel, MD, [facility] 01/16/2019, 3:37 PM [facility]. [place] Pager: [phone] Office: [phone] If no answer Cell [phone]

## 2019-01-17 ENCOUNTER — Ambulatory Visit: Payer: Federal, State, Local not specified - PPO | Admitting: Cardiology

## 2019-01-17 ENCOUNTER — Other Ambulatory Visit: Payer: Self-pay | Admitting: Thoracic Surgery (Cardiothoracic Vascular Surgery)

## 2019-01-17 DIAGNOSIS — Z9889 Other specified postprocedural states: Secondary | ICD-10-CM

## 2019-01-19 ENCOUNTER — Telehealth (HOSPITAL_COMMUNITY): Payer: Self-pay

## 2019-01-19 DIAGNOSIS — I4891 Unspecified atrial fibrillation: Secondary | ICD-10-CM | POA: Diagnosis not present

## 2019-01-19 DIAGNOSIS — Z9889 Other specified postprocedural states: Secondary | ICD-10-CM | POA: Diagnosis not present

## 2019-01-19 DIAGNOSIS — Z0000 Encounter for general adult medical examination without abnormal findings: Secondary | ICD-10-CM | POA: Diagnosis not present

## 2019-01-19 NOTE — Telephone Encounter (Signed)
Pt insurance is active and benefits verified through [employer]. Co-pay $30.00, DED $0.00/$0.00 met, out of pocket $5,000.00/$1,599.06 met, co-insurance 0%. No pre-authorization required. Passport, 01/19/2019 @ 12:26PM, OOL#[id number]

## 2019-01-22 ENCOUNTER — Ambulatory Visit
Admission: RE | Admit: 2019-01-22 | Discharge: 2019-01-22 | Disposition: A | Discharge status: Home / Self Care | Payer: Federal, State, Local not specified - PPO | Source: Ambulatory Visit | Attending: Thoracic Surgery (Cardiothoracic Vascular Surgery) | Admitting: Thoracic Surgery (Cardiothoracic Vascular Surgery)

## 2019-01-22 ENCOUNTER — Other Ambulatory Visit: Payer: Self-pay

## 2019-01-22 ENCOUNTER — Ambulatory Visit (INDEPENDENT_AMBULATORY_CARE_PROVIDER_SITE_OTHER): Payer: Self-pay | Admitting: Physician Assistant

## 2019-01-22 ENCOUNTER — Telehealth (HOSPITAL_COMMUNITY): Payer: Self-pay | Admitting: Pharmacist

## 2019-01-22 VITALS — BP 123/83 | HR 73 | Temp 97.8°F | Resp 20 | Ht 72.0 in | Wt 184.0 lb

## 2019-01-22 DIAGNOSIS — Z9889 Other specified postprocedural states: Secondary | ICD-10-CM

## 2019-01-22 DIAGNOSIS — I340 Nonrheumatic mitral (valve) insufficiency: Secondary | ICD-10-CM

## 2019-01-22 DIAGNOSIS — Z954 Presence of other heart-valve replacement: Secondary | ICD-10-CM | POA: Diagnosis not present

## 2019-01-22 NOTE — Patient Instructions (Signed)
Make every effort to stay physically active, get some type of exercise on a regular basis, and stick to a "heart healthy diet".  The long term benefits for regular exercise and a healthy diet are critically important to your overall health and wellbeing.  You may return to driving an automobile as long as you are no longer requiring oral narcotic pain relievers during the daytime.  It would be wise to start driving only short distances during the daylight and gradually increase from there as you feel comfortable.  You are encouraged to enroll and participate in the outpatient cardiac rehab program beginning as soon as practical.  Make every effort to maintain a "heart-healthy" lifestyle with regular physical exercise and adherence to a low-fat, low-carbohydrate diet.  Continue to seek regular follow-up appointments with your primary care physician and/or cardiologist.

## 2019-01-22 NOTE — Progress Notes (Signed)
[address]       [place] [id number]             [phone]      Derrick DameWallace E Panameno Jr. is a 58 y.o. male patient s/p minimally-invasive mitral valve repair on 6/16. He returns today for a routine follow-up appointment.    1. S/P minimally-invasive mitral valve repair   2. Severe mitral valve regurgitation    Past Medical History:  Diagnosis Date  . Glaucoma   . Heart valve problem   . History of kidney stones   . MVP (mitral valve prolapse)   . S/P minimally-invasive mitral valve repair 12/26/2018   Complex valvuloplasty including artificial Gore-tex neochord placement x10 and 36 mm Sorin Memo 4D ring annuloplasty via right mini thoracotomy approach  . Severe mitral valve regurgitation    No past surgical history pertinent negatives on file. Scheduled Meds: Current Outpatient Medications on File Prior to Visit  Medication Sig Dispense Refill  . amiodarone (PACERONE) 200 MG tablet Take 1 tablet (200 mg total) by mouth daily. 60 tablet 1  . aspirin EC 81 MG EC tablet Take 1 tablet (81 mg total) by mouth daily.    Marland Kitchen. latanoprost (XALATAN) 0.005 % ophthalmic solution Place 1 drop into both eyes at bedtime.    . metoprolol tartrate (LOPRESSOR) 25 MG tablet Take 0.5 tablets (12.5 mg total) by mouth 2 (two) times daily. 30 tablet 1  . timolol (TIMOPTIC) 0.5 % ophthalmic solution Place 1 drop into the left eye daily.     Marland Kitchen. warfarin (COUMADIN) 2.5 MG tablet Take 1 tablet (2.5 mg total) by mouth 2 (two) times a week. 24 tablet 3  . warfarin (COUMADIN) 5 MG tablet Take 1 tablet (5 mg total) by mouth daily at 6 PM. As directed by coumadin clinic 100 tablet 1   No current facility-administered medications on file prior to visit.      Blood pressure 123/83, pulse 73, temperature 97.8 F (36.6 C), temperature source Skin, resp. rate 20, height 6' (1.829 m), weight 184 lb (83.5 kg), SpO2 99 %.  Subjective Mr. Derrick Craig returns today for his initial routine follow-up  visit.  He had a minimally invasive mitral valve repair on 12/26/2018.  He was discharged from the hospital on 12/31/2018.  Since leaving the hospital he has been doing quite well.  He has been able to walk 30 minutes a day without shortness of breath.  Objective  Cor: Regular rate and rhythm, no murmur Pulmonary: Clear to auscultation bilaterally and in all fields Abdominal: No tenderness no distention Wound: Clean, dry, and intact thoracotomy incision.  He does still have some chest tube sutures which were removed today.  No drainage. Extremity: No pedal edema noted.  CLINICAL DATA:  Mitral valve repair 12/26/2018  EXAM: CHEST - 2 VIEW  COMPARISON:  12/31/2018  FINDINGS: There is no focal consolidation. There is no pleural effusion or pneumothorax. The heart and mediastinal contours are unremarkable. There is evidence of prior mitral valve repair.  There is no acute osseous abnormality.  IMPRESSION: No active cardiopulmonary disease.   Electronically Signed   By: Elige KoHetal  Patel   On: 01/22/2019 11:25   Assessment & Plan   The patient is a 58 year old male who recently underwent a minimally invasive mitral valve repair with Dr. Cornelius Moraswen.  Overall he is doing very well.  He has been increasing his activity level and is now up to 30 minutes a day of  walking without shortness of breath.  He has been sticking to a 10 pound weight limitation and I encouraged him to increase this weight by 2 pounds a week as tolerated.  He has been referred to a cardiac rehab program and his assessment is on 01/30/2019.  He plans to attend.  He has been following up with Dr. Einar Gip and he reduced his amiodarone down to 200 mg a day.  He remains on Coumadin and will do so for about 3 months.  His last INR was subtherapeutic at 1.7, therefore his Coumadin amount was increased.  He has an echocardiogram scheduled on 02/02/2019 and Dr. Irven Shelling office.  We will need to access this study when he comes back and  sees Dr. Roxy Manns in 2 months.  He has not taken any oxycodone in the postoperative period and only required Tylenol for pain control.  He stopped taking Tylenol about 2 weeks ago because he had limited pain and did not require it anymore.  He is cleared to drive today since he is no longer requiring any pain medication and overall feels well.  He is encouraged to continue his current activity level and to maintain a healthy lifestyle.  He states that his weight has maintained since his hospital stay and he has been eating a healthy diet.  The patient's chest x-ray was reviewed and there was no evidence of a pneumothorax or pleural effusions.  All the patient's questions were answered to his satisfaction.  We will plan for him to follow-up in our office with Dr. Roxy Manns in 2 months.  If he needs to be seen sooner for any reason he is to call our office and schedule an appointment.   No medication changes were made today.   Elgie Collard 01/22/2019

## 2019-01-22 NOTE — Telephone Encounter (Signed)
Cardiac Rehab Medication Review by a Pharmacist  Does the patient  feel that his/her medications are working for him/her?  yes  Has the patient been experiencing any side effects to the medications prescribed?  no  Does the patient measure his/her own blood pressure or blood glucose at home?  no - daily weights   Does the patient have any problems obtaining medications due to transportation or finances?   no  Understanding of regimen: good Understanding of indications: good Potential of compliance: good  Pharmacist comments: Patient has some questions regarding the timing of his scheduled appointments in the future. Advised him that he will receive more information at orientation and can ask further questions then.  Richardine Service, PharmD PGY1 Pharmacy Resident Direct Line: [phone] 01/22/2019 3:38 PM

## 2019-01-30 ENCOUNTER — Other Ambulatory Visit: Payer: Self-pay

## 2019-01-30 ENCOUNTER — Encounter (HOSPITAL_COMMUNITY): Payer: Self-pay

## 2019-01-30 ENCOUNTER — Ambulatory Visit (INDEPENDENT_AMBULATORY_CARE_PROVIDER_SITE_OTHER): Payer: Federal, State, Local not specified - PPO | Admitting: Cardiology

## 2019-01-30 ENCOUNTER — Encounter (HOSPITAL_COMMUNITY)
Admission: RE | Admit: 2019-01-30 | Discharge: 2019-01-30 | Disposition: A | Discharge status: Home / Self Care | Payer: Federal, State, Local not specified - PPO | Source: Ambulatory Visit | Attending: Cardiology | Admitting: Cardiology

## 2019-01-30 VITALS — BP 104/70 | HR 66 | Temp 97.3°F | Ht 72.0 in | Wt 185.8 lb

## 2019-01-30 DIAGNOSIS — Z9889 Other specified postprocedural states: Secondary | ICD-10-CM | POA: Insufficient documentation

## 2019-01-30 DIAGNOSIS — I341 Nonrheumatic mitral (valve) prolapse: Secondary | ICD-10-CM | POA: Diagnosis not present

## 2019-01-30 DIAGNOSIS — Z7901 Long term (current) use of anticoagulants: Secondary | ICD-10-CM

## 2019-01-30 DIAGNOSIS — Z5181 Encounter for therapeutic drug level monitoring: Secondary | ICD-10-CM

## 2019-01-30 LAB — POCT INR: INR: 2 (ref 2.0–3.0)

## 2019-01-30 NOTE — Progress Notes (Signed)
Cardiac Individual Treatment Plan  Patient Details  Name: Derrick DameWallace E Ottey Jr. MRN: [id number] Date of Birth: 1961/07/09 Referring Provider:     CARDIAC REHAB PHASE II OR[facility]ATION [facility] 01/30/2019 [facility]  Referring Provider  Dr. Jacinto HalimGanji       Initial Encounter Date:    CARDIAC REH[facility]ASE [facility]  Date  01/30/19      Visit Diagnosis: S/P mitral valve repair 12/26/18  Patient's Home Medications on Admission:  Current Outpatient Medications:  .  amiodarone (PACERONE) 200 MG tablet, Take 1 tablet (200 mg total) by mouth daily., Disp: 60 tablet, Rfl: 1 .  aspirin EC 81 MG EC tablet, Take 1 tablet (81 mg total) by mouth daily., Disp:  , Rfl:  .  latanoprost (XALATAN) 0.005 % ophthalmic solution, Place 1 drop into both eyes at bedtime., Disp: , Rfl:  .  metoprolol tartrate (LOPRESSOR) 25 MG tablet, Take 0.5 tablets (12.5 mg total) by mouth 2 (two) times daily., Disp: 30 tablet, Rfl: 1 .  timolol (TIMOPTIC) 0.5 % ophthalmic solution, Place 1 drop into the left eye daily. , Disp: , Rfl:  .  warfarin (COUMADIN) 2.5 MG tablet, Take 1 tablet (2.5 mg total) by mouth 2 (two) times a week., Disp: 24 tablet, Rfl: 3 .  warfarin (COUMADIN) 5 MG tablet, Take 1 tablet (5 mg total) by mouth daily at 6 PM. As directed by coumadin clinic, Disp: 100 tablet, Rfl: 1  Past Medical History: Past Medical History:  Diagnosis Date  . Glaucoma   . Heart valve problem   . History of kidney stones   . MVP (mitral valve prolapse)   . S/P minimally-invasive mitral valve repair 12/26/2018   Complex valvuloplasty including artificial Gore-tex neochord placement x10 and 36 mm Sorin Memo 4D ring annuloplasty via right mini thoracotomy approach  . Severe mitral valve regurgitation     Tobacco Use: Social History   Tobacco Use  Smoking Status Never Smoker  Smokeless Tobacco Never Used    Labs: Recent  Review Flowsheet Data    Labs for ITP Cardiac and Pulmonary Rehab Latest Ref Rng & Units 12/22/2018 12/26/2018 12/26/2018 12/26/2018 12/26/2018   Hemoglobin A1c 4.8 - 5.6 % 5.2 - - - -   PHART 7.350 - 7.450 7.395 7.336(L) 7.356 - -   PCO2ART 32.0 - 48.0 mmHg 40.6 49.4(H) 44.0 - -   HCO3 20.0 - 28.0 mmol/L 24.3 26.4 24.7 24.9 -   TCO2 22 - 32 mmol/L - 28 26 26 24    ACIDBASEDEF 0.0 - 2.0 mmol/L - - 1.0 2.0 -   O2SAT % 98.0 100.0 100.0 98.0 -      Capillary Blood Glucose: Lab Results  Component Value Date   GLUCAP 175 (H) 12/28/2018   GLUCAP 98 12/28/2018   GLUCAP 99 12/27/2018   GLUCAP 110 (H) 12/27/2018   GLUCAP 84 12/27/2018     Exercise Target Goals: Exercise Program Goal: Individual exercise prescription set using results from initial 6 min walk test and THRR while considering  patient's activity barriers and safety.   Exercise Prescription Goal: Initial exercise prescription builds to 30-45 minutes a day of aerobic activity, 2-3 days per week.  Home exercise guidelines will be given to patient during program as part of exercise prescription that the participant will acknowledge.  Activity Barriers & Risk Stratification: Activity Barriers & Cardiac Risk Stratification - 01/30/19 1158      Activity  Barriers & Cardiac Risk Stratification   Activity Barriers  None    Cardiac Risk Stratification  High       6 Minute Walk: 6 Minute Walk    Row Name 01/30/19 1158         6 Minute Walk   Phase  Initial     Distance  1683 feet     Walk Time  6 minutes     # of Rest Breaks  0     MPH  3.1     METS  4.2     RPE  9     Perceived Dyspnea   0     VO2 Peak  14.91     Symptoms  No     Resting HR  66 bpm     Resting BP  104/70     Resting Oxygen Saturation   100 %     Exercise Oxygen Saturation  during 6 min walk  100 %     Max Ex. HR  74 bpm     Max Ex. BP  112/72     2 Minute Post BP  102/68        Oxygen Initial Assessment:   Oxygen Re-Evaluation:   Oxygen  Discharge (Final Oxygen Re-Evaluation):   Initial Exercise Prescription: Initial Exercise Prescription - 01/30/19 1100      Date of Initial Exercise RX and Referring Provider   Date  01/30/19    Referring Provider  Dr. Jacinto Halim     Expected Discharge Date  03/16/19      Treadmill   MPH  3.5    Grade  1    Minutes  15      Recumbant Bike   Level  2.5    Watts  60.6    Minutes  15    METs  4      Prescription Details   Frequency (times per week)  3    Duration  Progress to 30 minutes of continuous aerobic without signs/symptoms of physical distress      Intensity   THRR 40-80% of Max Heartrate  66-132    Ratings of Perceived Exertion  11-13      Progression   Progression  Continue to progress workloads to maintain intensity without signs/symptoms of physical distress.      Resistance Training   Training Prescription  Yes    Weight  5 lbs.     Reps  10-15       Perform Capillary Blood Glucose checks as needed.  Exercise Prescription Changes:   Exercise Comments:   Exercise Goals and Review: Exercise Goals    Row Name 01/30/19 1201             Exercise Goals   Increase Physical Activity  Yes       Intervention  Provide advice, education, support and counseling about physical activity/exercise needs.;Develop an individualized exercise prescription for aerobic and resistive training based on initial evaluation findings, risk stratification, comorbidities and participant's personal goals.       Expected Outcomes  Short Term: Attend rehab on a regular basis to increase amount of physical activity.;Long Term: Add in home exercise to make exercise part of routine and to increase amount of physical activity.;Long Term: Exercising regularly at least 3-5 days a week.       Increase Strength and Stamina  Yes       Intervention  Provide advice, education, support and counseling about physical activity/exercise needs.;Develop  an individualized exercise prescription for  aerobic and resistive training based on initial evaluation findings, risk stratification, comorbidities and participant's personal goals.       Expected Outcomes  Short Term: Increase workloads from initial exercise prescription for resistance, speed, and METs.;Short Term: Perform resistance training exercises routinely during rehab and add in resistance training at home;Long Term: Improve cardiorespiratory fitness, muscular endurance and strength as measured by increased METs and functional capacity (6MWT)       Able to understand and use rate of perceived exertion (RPE) scale  Yes       Intervention  Provide education and explanation on how to use RPE scale       Expected Outcomes  Short Term: Able to use RPE daily in rehab to express subjective intensity level;Long Term:  Able to use RPE to guide intensity level when exercising independently       Knowledge and understanding of Target Heart Rate Range (THRR)  Yes       Intervention  Provide education and explanation of THRR including how the numbers were predicted and where they are located for reference       Expected Outcomes  Short Term: Able to state/look up THRR;Long Term: Able to use THRR to govern intensity when exercising independently;Short Term: Able to use daily as guideline for intensity in rehab       Able to check pulse independently  Yes       Intervention  Provide education and demonstration on how to check pulse in carotid and radial arteries.;Review the importance of being able to check your own pulse for safety during independent exercise       Expected Outcomes  Short Term: Able to explain why pulse checking is important during independent exercise;Long Term: Able to check pulse independently and accurately       Understanding of Exercise Prescription  Yes       Intervention  Provide education, explanation, and written materials on patient's individual exercise prescription       Expected Outcomes  Short Term: Able to explain  program exercise prescription;Long Term: Able to explain home exercise prescription to exercise independently          Exercise Goals Re-Evaluation :   Discharge Exercise Prescription (Final Exercise Prescription Changes):   Nutrition:  Target Goals: Understanding of nutrition guidelines, daily intake of sodium 1500mg , cholesterol 200mg , calories 30% from fat and 7% or less from saturated fats, daily to have 5 or more servings of fruits and vegetables.  Biometrics: Pre Biometrics - 01/30/19 1201      Pre Biometrics   Height  6' (1.829 m)    Weight  84.3 kg    Waist Circumference  36.5 inches    Hip Circumference  40 inches    Waist to Hip Ratio  0.91 %    BMI (Calculated)  25.2    Triceps Skinfold  24 mm    % Body Fat  26.1 %    Grip Strength  49 kg    Flexibility  14 in    Single Leg Stand  12.56 seconds        Nutrition Therapy Plan and Nutrition Goals:   Nutrition Assessments:   Nutrition Goals Re-Evaluation:   Nutrition Goals Re-Evaluation:   Nutrition Goals Discharge (Final Nutrition Goals Re-Evaluation):   Psychosocial: Target Goals: Acknowledge presence or absence of significant depression and/or stress, maximize coping skills, provide positive support system. Participant is able to verbalize types and ability to use  techniques and skills needed for reducing stress and depression.  Initial Review & Psychosocial Screening: Initial Psych Review & Screening - 01/30/19 1047      Initial Review   Current issues with  None Identified      Family Dynamics   Good Support System?  Yes   [name] lives alone has firends and neghbors for support since his immediate family memebers are deceased     Barriers   Psychosocial barriers to participate in program  There are no identifiable barriers or psychosocial needs.      Screening Interventions   Interventions  Encouraged to exercise       Quality of Life Scores: Quality of Life - 01/30/19 1202       Quality of Life   Select  Quality of Life      Quality of Life Scores   Health/Function Pre  27.86 %    Socioeconomic Pre  29.14 %    Psych/Spiritual Pre  29.14 %    Family Pre  0 %    GLOBAL Pre  28.5 %      Scores of 19 and below usually indicate a poorer quality of life in these areas.  A difference of  2-3 points is a clinically meaningful difference.  A difference of 2-3 points in the total score of the Quality of Life Index has been associated with significant improvement in overall quality of life, self-image, physical symptoms, and general health in studies assessing change in quality of life.  PHQ-9: Recent Review Flowsheet Data    Depression screen [facility] 2/9 01/30/2019   Decreased Interest 0   Down, Depressed, Hopeless 0   PHQ - 2 Score 0     Interpretation of Total Score  Total Score Depression Severity:  1-4 = Minimal depression, 5-9 = Mild depression, 10-14 = Moderate depression, 15-19 = Moderately severe depression, 20-27 = Severe depression   Psychosocial Evaluation and Intervention:   Psychosocial Re-Evaluation:   Psychosocial Discharge (Final Psychosocial Re-Evaluation):   Vocational Rehabilitation: Provide vocational rehab assistance to qualifying candidates.   Vocational Rehab Evaluation & Intervention: Vocational Rehab - 01/30/19 1227      Initial Vocational Rehab Evaluation & Intervention   Assessment shows need for Vocational Rehabilitation  No       Education: Education Goals: Education classes will be provided on a weekly basis, covering required topics. Participant will state understanding/return demonstration of topics presented.  Learning Barriers/Preferences: Learning Barriers/Preferences - 01/30/19 1202      Learning Barriers/Preferences   Learning Barriers  None    Learning Preferences  Audio;Verbal Instruction;Written Material       Education Topics: Count Your Pulse:  -Group instruction provided by verbal instruction,  demonstration, patient participation and written materials to support subject.  Instructors address importance of being able to find your pulse and how to count your pulse when at home without a heart monitor.  Patients get hands on experience counting their pulse with staff help and individually.   Heart Attack, Angina, and Risk Factor Modification:  -Group instruction provided by verbal instruction, video, and written materials to support subject.  Instructors address signs and symptoms of angina and heart attacks.    Also discuss risk factors for heart disease and how to make changes to improve heart health risk factors.   Functional Fitness:  -Group instruction provided by verbal instruction, demonstration, patient participation, and written materials to support subject.  Instructors address safety measures for doing things around the house.  Discuss  how to get up and down off the floor, how to pick things up properly, how to safely get out of a chair without assistance, and balance training.   Meditation and Mindfulness:  -Group instruction provided by verbal instruction, patient participation, and written materials to support subject.  Instructor addresses importance of mindfulness and meditation practice to help reduce stress and improve awareness.  Instructor also leads participants through a meditation exercise.    Stretching for Flexibility and Mobility:  -Group instruction provided by verbal instruction, patient participation, and written materials to support subject.  Instructors lead participants through series of stretches that are designed to increase flexibility thus improving mobility.  These stretches are additional exercise for major muscle groups that are typically performed during regular warm up and cool down.   Hands Only CPR:  -Group verbal, video, and participation provides a basic overview of AHA guidelines for community CPR. Role-play of emergencies allow participants  the opportunity to practice calling for help and chest compression technique with discussion of AED use.   Hypertension: -Group verbal and written instruction that provides a basic overview of hypertension including the most recent diagnostic guidelines, risk factor reduction with self-care instructions and medication management.    Nutrition I class: Heart Healthy Eating:  -Group instruction provided by PowerPoint slides, verbal discussion, and written materials to support subject matter. The instructor gives an explanation and review of the Therapeutic Lifestyle Changes diet recommendations, which includes a discussion on lipid goals, dietary fat, sodium, fiber, plant stanol/sterol esters, sugar, and the components of a well-balanced, healthy diet.   Nutrition II class: Lifestyle Skills:  -Group instruction provided by PowerPoint slides, verbal discussion, and written materials to support subject matter. The instructor gives an explanation and review of label reading, grocery shopping for heart health, heart healthy recipe modifications, and ways to make healthier choices when eating out.   Diabetes Question & Answer:  -Group instruction provided by PowerPoint slides, verbal discussion, and written materials to support subject matter. The instructor gives an explanation and review of diabetes co-morbidities, pre- and post-prandial blood glucose goals, pre-exercise blood glucose goals, signs, symptoms, and treatment of hypoglycemia and hyperglycemia, and foot care basics.   Diabetes Blitz:  -Group instruction provided by PowerPoint slides, verbal discussion, and written materials to support subject matter. The instructor gives an explanation and review of the physiology behind type 1 and type 2 diabetes, diabetes medications and rational behind using different medications, pre- and post-prandial blood glucose recommendations and Hemoglobin A1c goals, diabetes diet, and exercise including blood  glucose guidelines for exercising safely.    Portion Distortion:  -Group instruction provided by PowerPoint slides, verbal discussion, written materials, and food models to support subject matter. The instructor gives an explanation of serving size versus portion size, changes in portions sizes over the last 20 years, and what consists of a serving from each food group.   Stress Management:  -Group instruction provided by verbal instruction, video, and written materials to support subject matter.  Instructors review role of stress in heart disease and how to cope with stress positively.     Exercising on Your Own:  -Group instruction provided by verbal instruction, power point, and written materials to support subject.  Instructors discuss benefits of exercise, components of exercise, frequency and intensity of exercise, and end points for exercise.  Also discuss use of nitroglycerin and activating EMS.  Review options of places to exercise outside of rehab.  Review guidelines for sex with heart disease.  Cardiac Drugs I:  -Group instruction provided by verbal instruction and written materials to support subject.  Instructor reviews cardiac drug classes: antiplatelets, anticoagulants, beta blockers, and statins.  Instructor discusses reasons, side effects, and lifestyle considerations for each drug class.   Cardiac Drugs II:  -Group instruction provided by verbal instruction and written materials to support subject.  Instructor reviews cardiac drug classes: angiotensin converting enzyme inhibitors (ACE-I), angiotensin II receptor blockers (ARBs), nitrates, and calcium channel blockers.  Instructor discusses reasons, side effects, and lifestyle considerations for each drug class.   Anatomy and Physiology of the Circulatory System:  Group verbal and written instruction and models provide basic cardiac anatomy and physiology, with the coronary electrical and arterial systems. Review of: AMI,  Angina, Valve disease, Heart Failure, Peripheral Artery Disease, Cardiac Arrhythmia, Pacemakers, and the ICD.   Other Education:  -Group or individual verbal, written, or video instructions that support the educational goals of the cardiac rehab program.   Holiday Eating Survival Tips:  -Group instruction provided by PowerPoint slides, verbal discussion, and written materials to support subject matter. The instructor gives patients tips, tricks, and techniques to help them not only survive but enjoy the holidays despite the onslaught of food that accompanies the holidays.   Knowledge Questionnaire Score: Knowledge Questionnaire Score - 01/30/19 1204      Knowledge Questionnaire Score   Pre Score  21/24       Core Components/Risk Factors/Patient Goals at Admission: Personal Goals and Risk Factors at Admission - 01/30/19 1204      Core Components/Risk Factors/Patient Goals on Admission    Weight Management  Yes;Weight Maintenance    Admit Weight  185 lb 13.6 oz (84.3 kg)    Stress  Yes    Intervention  Offer individual and/or small group education and counseling on adjustment to heart disease, stress management and health-related lifestyle change. Teach and support self-help strategies.;Refer participants experiencing significant psychosocial distress to appropriate mental health specialists for further evaluation and treatment. When possible, include family members and significant others in education/counseling sessions.       Core Components/Risk Factors/Patient Goals Review:    Core Components/Risk Factors/Patient Goals at Discharge (Final Review):    ITP Comments: ITP Comments    Row Name 01/30/19 1046           ITP Comments  Dr Armanda Magicraci Turner, Medical Director          Comments: Link Snufferddie attended orientation on 01/30/2019 to review rules and guidelines for program.  Completed 6 minute walk test, Intitial ITP, and exercise prescription.  VSS. Telemetry-Sinus Rhythm.   Asymptomatic. Safety measures and social distancing in place per CDC guidelines.Gladstone LighterMaria Whitaker, RN,BSN 01/30/2019 12:31 PM

## 2019-02-02 ENCOUNTER — Ambulatory Visit (INDEPENDENT_AMBULATORY_CARE_PROVIDER_SITE_OTHER): Payer: Federal, State, Local not specified - PPO

## 2019-02-02 ENCOUNTER — Other Ambulatory Visit: Payer: Self-pay

## 2019-02-02 DIAGNOSIS — Z9889 Other specified postprocedural states: Secondary | ICD-10-CM | POA: Diagnosis not present

## 2019-02-02 DIAGNOSIS — I480 Paroxysmal atrial fibrillation: Secondary | ICD-10-CM

## 2019-02-05 ENCOUNTER — Other Ambulatory Visit: Payer: Self-pay

## 2019-02-05 ENCOUNTER — Encounter (HOSPITAL_COMMUNITY)
Admission: RE | Admit: 2019-02-05 | Discharge: 2019-02-05 | Disposition: A | Discharge status: Home / Self Care | Payer: Federal, State, Local not specified - PPO | Source: Ambulatory Visit | Attending: Cardiology | Admitting: Cardiology

## 2019-02-05 DIAGNOSIS — Z9889 Other specified postprocedural states: Secondary | ICD-10-CM | POA: Diagnosis not present

## 2019-02-05 NOTE — Progress Notes (Signed)
Daily Session Note  Patient Details  Name: Derrick Craig. MRN: [id number] Date of Birth: [birth year] Referring Provider:     CARDIAC REHAB PHASE II ORIENTATION from 01/30/2019 in [place]  Referring Provider  Dr. Einar Craig       Encounter Date: 02/05/2019  Check In: Session Check In - 02/05/19 1341      Check-In   Supervising physician immediately available to respond to emergencies  [facility] immediately available    Physician(s)  Dr. [place] Craig    Location  [facility]    Staff Present  Derrick Craig, BS, ACSM CEP, Exercise Physiologist; , RN, Derrick Epstein, MS,ACSM CEP, Exercise Physiologist;Derrick Karle Starch, RN, Derrick Pretty, MS, ACSM CEP, Exercise Physiologist    Virtual Visit  No    Medication changes reported      No    Fall or balance concerns reported     No    Tobacco Cessation  No Change    Warm-up and Cool-down  Performed on first and last piece of equipment    Resistance Training Performed  Yes    VAD Patient?  No    PAD/SET Patient?  No      Pain Assessment   Currently in Pain?  No/denies    Multiple Pain Sites  No       Capillary Blood Glucose: No results found for this or any previous visit (from the past 24 hour(s)).    Social History   Tobacco Use  Smoking Status Never Smoker  Smokeless Tobacco Never Used    Goals Met:  Exercise tolerated well  Goals Unmet:  Not Applicable  Comments: Derrick Craig started cardiac rehab today.  Pt tolerated light exercise without difficulty. VSS, telemetry-Sinus Rhythm, asymptomatic.  Medication list reconciled. Pt denies barriers to medicaiton compliance.  PSYCHOSOCIAL ASSESSMENT:  PHQ-0. Pt exhibits positive coping skills, hopeful outlook. Quality of life  Review.Family portion of QOL =0 Derrick Craig does not have any living immediate family but he does have friends  No psychosocial needs identified at this time, no psychosocial interventions necessary.  Derrick Craig denies being depressed.  Pt enjoys spending time outdoors.   Pt oriented to exercise equipment and routine.    Understanding verbalized.   Dr. Fransico Craig is Medical Director for Cardiac Rehab at [facility].

## 2019-02-07 ENCOUNTER — Encounter (HOSPITAL_COMMUNITY)
Admission: RE | Admit: 2019-02-07 | Discharge: 2019-02-07 | Disposition: A | Discharge status: Home / Self Care | Payer: Federal, State, Local not specified - PPO | Source: Ambulatory Visit | Attending: Cardiology | Admitting: Cardiology

## 2019-02-07 ENCOUNTER — Other Ambulatory Visit: Payer: Self-pay

## 2019-02-07 DIAGNOSIS — Z9889 Other specified postprocedural states: Secondary | ICD-10-CM

## 2019-02-09 ENCOUNTER — Encounter (HOSPITAL_COMMUNITY)
Admission: RE | Admit: 2019-02-09 | Discharge: 2019-02-09 | Disposition: A | Discharge status: Home / Self Care | Payer: Federal, State, Local not specified - PPO | Source: Ambulatory Visit | Attending: Cardiology | Admitting: Cardiology

## 2019-02-09 ENCOUNTER — Other Ambulatory Visit: Payer: Self-pay

## 2019-02-09 DIAGNOSIS — Z9889 Other specified postprocedural states: Secondary | ICD-10-CM

## 2019-02-12 ENCOUNTER — Encounter (HOSPITAL_COMMUNITY)
Admission: RE | Admit: 2019-02-12 | Discharge: 2019-02-12 | Disposition: A | Discharge status: Home / Self Care | Payer: Federal, State, Local not specified - PPO | Source: Ambulatory Visit | Attending: Cardiology | Admitting: Cardiology

## 2019-02-12 ENCOUNTER — Other Ambulatory Visit: Payer: Self-pay

## 2019-02-12 ENCOUNTER — Ambulatory Visit: Payer: Federal, State, Local not specified - PPO | Admitting: Cardiology

## 2019-02-12 DIAGNOSIS — Z9889 Other specified postprocedural states: Secondary | ICD-10-CM | POA: Insufficient documentation

## 2019-02-12 NOTE — Progress Notes (Signed)
INR improved with recent changes and is within range. Will repeat in 2 weeks for close monitoring. No changes.

## 2019-02-13 ENCOUNTER — Ambulatory Visit (INDEPENDENT_AMBULATORY_CARE_PROVIDER_SITE_OTHER): Payer: Federal, State, Local not specified - PPO | Admitting: Cardiology

## 2019-02-13 ENCOUNTER — Other Ambulatory Visit: Payer: Self-pay

## 2019-02-13 DIAGNOSIS — Z9889 Other specified postprocedural states: Secondary | ICD-10-CM | POA: Diagnosis not present

## 2019-02-13 DIAGNOSIS — Z5181 Encounter for therapeutic drug level monitoring: Secondary | ICD-10-CM | POA: Diagnosis not present

## 2019-02-13 DIAGNOSIS — Z7901 Long term (current) use of anticoagulants: Secondary | ICD-10-CM | POA: Diagnosis not present

## 2019-02-13 LAB — POCT INR: INR: 1.7 — AB (ref 2.0–3.0)

## 2019-02-14 ENCOUNTER — Encounter (HOSPITAL_COMMUNITY)
Admission: RE | Admit: 2019-02-14 | Discharge: 2019-02-14 | Disposition: A | Discharge status: Home / Self Care | Payer: Federal, State, Local not specified - PPO | Source: Ambulatory Visit | Attending: Cardiology | Admitting: Cardiology

## 2019-02-14 ENCOUNTER — Other Ambulatory Visit: Payer: Self-pay

## 2019-02-14 DIAGNOSIS — Z9889 Other specified postprocedural states: Secondary | ICD-10-CM

## 2019-02-15 NOTE — Progress Notes (Signed)
Previous recordings of dosing were wrong. Patient has been taking 7.5 mg 5 days a week and 5 mg other days. INR is not at goal, will increase to 7.5 mg daily. Will repeat in 2 weeks.

## 2019-02-16 ENCOUNTER — Other Ambulatory Visit: Payer: Self-pay

## 2019-02-16 ENCOUNTER — Encounter (HOSPITAL_COMMUNITY)
Admission: RE | Admit: 2019-02-16 | Discharge: 2019-02-16 | Disposition: A | Discharge status: Home / Self Care | Payer: Federal, State, Local not specified - PPO | Source: Ambulatory Visit | Attending: Cardiology | Admitting: Cardiology

## 2019-02-16 DIAGNOSIS — Z9889 Other specified postprocedural states: Secondary | ICD-10-CM | POA: Diagnosis not present

## 2019-02-19 ENCOUNTER — Encounter (HOSPITAL_COMMUNITY)
Admission: RE | Admit: 2019-02-19 | Discharge: 2019-02-19 | Disposition: A | Discharge status: Home / Self Care | Payer: Federal, State, Local not specified - PPO | Source: Ambulatory Visit | Attending: Cardiology | Admitting: Cardiology

## 2019-02-19 ENCOUNTER — Other Ambulatory Visit: Payer: Self-pay

## 2019-02-19 DIAGNOSIS — Z9889 Other specified postprocedural states: Secondary | ICD-10-CM

## 2019-02-20 ENCOUNTER — Ambulatory Visit (INDEPENDENT_AMBULATORY_CARE_PROVIDER_SITE_OTHER): Payer: Federal, State, Local not specified - PPO | Admitting: Cardiology

## 2019-02-20 DIAGNOSIS — Z7901 Long term (current) use of anticoagulants: Secondary | ICD-10-CM | POA: Diagnosis not present

## 2019-02-20 DIAGNOSIS — Z9889 Other specified postprocedural states: Secondary | ICD-10-CM

## 2019-02-20 DIAGNOSIS — Z5181 Encounter for therapeutic drug level monitoring: Secondary | ICD-10-CM | POA: Diagnosis not present

## 2019-02-20 LAB — POCT INR: INR: 2.1 (ref 2.0–3.0)

## 2019-02-20 NOTE — Progress Notes (Signed)
INR within range with recent changes. Will continue the same. Tolerating coumadin well. He is scheduled for OV on 08/19 and will repeat INR at that time.

## 2019-02-21 ENCOUNTER — Other Ambulatory Visit: Payer: Self-pay

## 2019-02-21 ENCOUNTER — Encounter (HOSPITAL_COMMUNITY)
Admission: RE | Admit: 2019-02-21 | Discharge: 2019-02-21 | Disposition: A | Discharge status: Home / Self Care | Payer: Federal, State, Local not specified - PPO | Source: Ambulatory Visit | Attending: Cardiology | Admitting: Cardiology

## 2019-02-21 DIAGNOSIS — Z9889 Other specified postprocedural states: Secondary | ICD-10-CM

## 2019-02-21 NOTE — Progress Notes (Addendum)
Patient reports that he feels a little lightheaded when he changes positions. Post exercise blood pressure 102/68 sitting. Standing blood pressure 104/70. Heart rate 71. Derrick Craig said that he felt a little lightheaded yesterday when he was getting his blood drawn at the cardiologist office. He did not notify the staff of his complaint at the time. Medication's reviewed. Derrick Craig says he is taking his medications as prescribed. Dr Irven Shelling office called and notified. Will fax exercise flow sheets to Dr. Irven Shelling office for review.Derrick Craig says he drinks 2 20 ounces of water twice a day. Patient was encouraged to make sure he is drinking enough water throughout the day.Patient states understanding. Barnet Pall, RN,BSN 02/21/2019 3:02 PM

## 2019-02-23 ENCOUNTER — Other Ambulatory Visit: Payer: Self-pay

## 2019-02-23 ENCOUNTER — Encounter (HOSPITAL_COMMUNITY)
Admission: RE | Admit: 2019-02-23 | Discharge: 2019-02-23 | Disposition: A | Discharge status: Home / Self Care | Payer: Federal, State, Local not specified - PPO | Source: Ambulatory Visit | Attending: Cardiology | Admitting: Cardiology

## 2019-02-23 DIAGNOSIS — Z9889 Other specified postprocedural states: Secondary | ICD-10-CM | POA: Diagnosis not present

## 2019-02-23 NOTE — Progress Notes (Signed)
I have reviewed a Home Exercise Prescription with Lorna Dibble. Derrick Craig is currently exercising at home by walking 4 days per week for 60 minutes in addition to CR.  The patient was advised to walk 2-4 days a week for 30-45 minutes.  Derrick Craig and I discussed how to progress their exercise prescription.  The patient stated that their goals were to continue walking for exercise. Pt expressed interest in incorporating resistance training some days per week by tire flipping. Advised Pt to not start this until he was using more weight during CR with free weights and was cleared by doctor to do so.  The patient stated that they understand the exercise prescription.  We reviewed exercise guidelines, target heart rate during exercise, RPE Scale, weather conditions, NTG use, endpoints for exercise, warmup and cool down.  Patient is encouraged to come to me with any questions. I will continue to follow up with the patient to assist them with progression and safety.    Deitra Mayo BS, ACSM CEP 02/23/2019 3:10 PM

## 2019-02-26 ENCOUNTER — Encounter (HOSPITAL_COMMUNITY)
Admission: RE | Admit: 2019-02-26 | Discharge: 2019-02-26 | Disposition: A | Discharge status: Home / Self Care | Payer: Federal, State, Local not specified - PPO | Source: Ambulatory Visit | Attending: Cardiology | Admitting: Cardiology

## 2019-02-26 ENCOUNTER — Other Ambulatory Visit: Payer: Self-pay

## 2019-02-26 DIAGNOSIS — Z9889 Other specified postprocedural states: Secondary | ICD-10-CM | POA: Diagnosis not present

## 2019-02-28 ENCOUNTER — Ambulatory Visit (INDEPENDENT_AMBULATORY_CARE_PROVIDER_SITE_OTHER): Payer: Federal, State, Local not specified - PPO | Admitting: Cardiology

## 2019-02-28 ENCOUNTER — Encounter (HOSPITAL_COMMUNITY): Payer: Federal, State, Local not specified - PPO

## 2019-02-28 ENCOUNTER — Encounter: Payer: Self-pay | Admitting: Cardiology

## 2019-02-28 ENCOUNTER — Other Ambulatory Visit: Payer: Self-pay

## 2019-02-28 DIAGNOSIS — I480 Paroxysmal atrial fibrillation: Secondary | ICD-10-CM

## 2019-02-28 DIAGNOSIS — Z9889 Other specified postprocedural states: Secondary | ICD-10-CM

## 2019-02-28 DIAGNOSIS — I4891 Unspecified atrial fibrillation: Secondary | ICD-10-CM

## 2019-02-28 HISTORY — DX: Unspecified atrial fibrillation: I48.91

## 2019-02-28 LAB — POCT INR: INR: 1.5 — AB (ref 2.0–3.0)

## 2019-02-28 MED ORDER — WARFARIN SODIUM 5 MG PO TABS: 5.0000 mg | ORAL_TABLET | Freq: Every day | ORAL | 1 refills | Status: DC

## 2019-02-28 MED ORDER — METOPROLOL TARTRATE 25 MG PO TABS: 12.5000 mg | ORAL_TABLET | Freq: Two times a day (BID) | ORAL | 1 refills | Status: DC

## 2019-02-28 MED ORDER — WARFARIN SODIUM 10 MG PO TABS: 10.0000 mg | ORAL_TABLET | Freq: Once | ORAL | 1 refills | Status: DC

## 2019-02-28 NOTE — Progress Notes (Signed)
Primary Physician/Referring:  Pearson GrippeKim, James, MD  Patient ID: Derrick DameWallace E Korman Jr., male    DOB: 1960-07-14, 58 y.o.   MRN: [id number]  Chief Complaint  Patient presents with  . Atrial Fibrillation  . Follow-up   HPI: Derrick DameWallace E Bedingfield Jr.  is a 58 y.o. male  Caucasian male with history of mitral valve prolapse and moderate to severe MR, had become symptomatic with dyspnea and underwent minimally invasive mitral valve repair on 12/26/2018 by Dr. Tressie Stalkerlarence Owen, no presents for follow-up.  He had postop A. fib and was started on Coumadin and as it was rate controlled he was discharged home with outpatient follow-up.  We have been monitoring his Coumadin and INR.  He is presently asymptomatic.  I had reduce the dose of amiodarone from 200 mg b.i.d. to 200 mg daily.  He has noticed decreasing right pectoral muscle tenderness and also swelling.  Occasional dizziness when he suddenly stands up.  No bleeding diathesis on warfarin.  Past Medical History:  Diagnosis Date  . Atrial fibrillation (HCC) 02/28/2019  . Glaucoma   . Heart valve problem   . History of kidney stones   . MVP (mitral valve prolapse)   . S/P minimally-invasive mitral valve repair 12/26/2018   Complex valvuloplasty including artificial Gore-tex neochord placement x10 and 36 mm Sorin Memo 4D ring annuloplasty via right mini thoracotomy approach  . Severe mitral valve regurgitation     Past Surgical History:  Procedure Laterality Date  . CARDIAC CATHETERIZATION    . MITRAL VALVE REPAIR Right 12/26/2018   Procedure: MINIMALLY INVASIVE MITRAL VALVE REPAIR (MVR) using Memo 4D Ring Size 36mm;  Surgeon: Purcell Nailswen, Clarence H, MD;  [facility];  Service: Open Heart Surgery;  Laterality: Right;  . RIGHT/LEFT HEART CATH AND CORONARY ANGIOGRAPHY N/A 09/05/2018   Procedure: RIGHT/LEFT HEART CATH AND CORONARY ANGIOGRAPHY;  Surgeon: Elder NegusPatwardhan, Manish J, MD;  Location: [facility] INVASIVE CV LAB;  Service: Cardiovascular;  Laterality: N/A;  . TEE  WITHOUT CARDIOVERSION N/A 09/05/2018   Procedure: TRANSESOPHAGEAL ECHOCARDIOGRAM (TEE);  Surgeon: Yates DecampGanji, Kawhi Diebold, MD;  [facility];  Service: Cardiovascular;  Laterality: N/A;  cath to follow  . TEE WITHOUT CARDIOVERSION N/A 12/26/2018   Procedure: TRANSESOPHAGEAL ECHOCARDIOGRAM (TEE);  Surgeon: Purcell Nailswen, Clarence H, MD;  [facility];  Service: Open Heart Surgery;  Laterality: N/A;  . TONSILLECTOMY      Social History   Socioeconomic History  . Marital status: Single    Spouse name: Not on file  . Number of children: 0  . Years of education: 212  . Highest education level: Not on file  Occupational History  . Not on file  Social Needs  . Financial resource strain: Not hard at all  . Food insecurity    Worry: Never true    Inability: Never true  . Transportation needs    Medical: No    Non-medical: No  Tobacco Use  . Smoking status: Never Smoker  . Smokeless tobacco: Never Used  Substance and Sexual Activity  . Alcohol use: No  . Drug use: Never  . Sexual activity: Not on file  Lifestyle  . Physical activity    Days per week: 5 days    Minutes per session: 40 min  . Stress: Not at all  Relationships  . Social Musicianconnections    Talks on phone: Not on file    Gets together: Not on file    Attends religious service: Not on file    Active member  of club or organization: Not on file    Attends meetings of clubs or organizations: Not on file    Relationship status: Not on file  . Intimate partner violence    Fear of current or ex partner: Not on file    Emotionally abused: Not on file    Physically abused: Not on file    Forced sexual activity: Not on file  Other Topics Concern  . Not on file  Social History Narrative  . Not on file   Review of Systems  Constitution: Negative for chills, decreased appetite, malaise/fatigue and weight gain.  Cardiovascular: Negative for dyspnea on exertion, leg swelling and syncope.  Endocrine: Negative for cold intolerance.   Hematologic/Lymphatic: Does not bruise/bleed easily.  Musculoskeletal: Negative for joint swelling.  Gastrointestinal: Negative for abdominal pain, anorexia, change in bowel habit, hematochezia and melena.  Neurological: Negative for headaches and light-headedness.  Psychiatric/Behavioral: Negative for depression and substance abuse.  All other systems reviewed and are negative.  Objective  Blood pressure 130/80, pulse 80, height 6' (1.829 m), weight 188 lb 9.6 oz (85.5 kg), SpO2 99 %. Body mass index is 25.58 kg/m.    Physical Exam  Constitutional: He appears well-developed and well-nourished. No distress.  HENT:  Head: Atraumatic.  Eyes: Conjunctivae are normal.  Neck: Neck supple. No JVD present. No thyromegaly present.  Cardiovascular: Normal rate, regular rhythm, normal heart sounds and intact distal pulses. Exam reveals no gallop.  No murmur heard. Pulmonary/Chest: Effort normal and breath sounds normal.  Right lateral  minithoracotomy scar noted, sutures present, appears healthy.  Abdominal: Soft. Bowel sounds are normal.  Musculoskeletal: Normal range of motion.  Neurological: He is alert.  Skin: Skin is warm and dry.  Psychiatric: He has a normal mood and affect.   Radiology: No results found.  Laboratory examination:   CMP Latest Ref Rng & Units 12/30/2018 12/29/2018 12/28/2018  Glucose 70 - 99 mg/dL 161(W114(H) 960(A116(H) 540(J110(H)  BUN 6 - 20 mg/dL 20 20 15   Creatinine 0.61 - 1.24 mg/dL 8.110.96 9.140.98 7.821.05  Sodium 135 - 145 mmol/L 138 138 138  Potassium 3.5 - 5.1 mmol/L 4.3 3.9 4.0  Chloride 98 - 111 mmol/L 100 100 103  CO2 22 - 32 mmol/L 28 28 30   Calcium 8.9 - 10.3 mg/dL 9.5(A8.7(L) 2.1(H8.5(L) 0.8(M8.2(L)  Total Protein 6.5 - 8.1 g/dL - - -  Total Bilirubin 0.3 - 1.2 mg/dL - - -  Alkaline Phos 38 - 126 U/L - - -  AST 15 - 41 U/L - - -  ALT 0 - 44 U/L - - -   CBC Latest Ref Rng & Units 12/30/2018 12/29/2018 12/28/2018  WBC 4.0 - 10.5 K/uL 8.5 11.9(H) 10.3  Hemoglobin 13.0 - 17.0 g/dL  12.9(L) 12.9(L) 12.1(L)  Hematocrit 39.0 - 52.0 % 38.2(L) 37.9(L) 34.6(L)  Platelets 150 - 400 K/uL 210 206 161   Lipid Panel  No results found for: CHOL, TRIG, HDL, CHOLHDL, VLDL, LDLCALC, LDLDIRECT HEMOGLOBIN A1C Lab Results  Component Value Date   HGBA1C 5.2 12/22/2018   MPG 102.54 12/22/2018   TSH No results for input(s): TSH in the last 8760 hours.   Medications   Current Outpatient Medications  Medication Instructions  . aspirin 81 mg, Oral, Daily  . latanoprost (XALATAN) 0.005 % ophthalmic solution 1 drop, Daily at bedtime  . metoprolol tartrate (LOPRESSOR) 12.5 mg, Oral, 2 times daily  . timolol (TIMOPTIC) 0.5 % ophthalmic solution 1 drop, Left Eye, Daily  . warfarin (COUMADIN) 10  mg, Oral, One time only - 1800, As directed by coumadin clinic  . warfarin (COUMADIN) 5 mg, Oral, Daily-1800, As directed by coumadin clinic    Cardiac Studies:   Right and left heart catheterization 09/05/2018: Normal coronary arteries with superdominant right coronary artery No coronary artery disease Normal filling pressures. Recommendation: Follow up with CVTS re: management of severe mitral regrgitation.  Carotid artery duplex 12/22/2018: Normal carotid artery study.  Echocardiogram 02/02/2019: Normal LV systolic function with EF 55%. Left ventricle cavity is normal in size. Mild concentric hypertrophy of the left ventricle. Normal global wall motion. S/p mitral annuloplasty with well seated ring. No residual mitral regurgitation seen.  No evidence of pulmonary hypertension. Compared to previous study pm 08/16/2018, mitral regurgitation is now resolved s/p mitral annuloplasty.   Assessment     ICD-10-CM   1. Paroxysmal atrial fibrillation (HCC)  I48.0 EKG 12-Lead    warfarin (COUMADIN) 10 MG tablet    warfarin (COUMADIN) 5 MG tablet    metoprolol tartrate (LOPRESSOR) 25 MG tablet   CHA2DS2-VASc Score is 1.  Yearly risk of stroke: 1.2%.    2. S/P mitral valve repair  Z98.890  POCT INR    warfarin (COUMADIN) 10 MG tablet   Mitral valve repair 12/16/2018: Minimally invasive repair with Gore-tex neochord placement x10. Sorin Memo 4D Journalist, newspaper (size 46mm)      Ref Range & Units 13:40 8d ago 2wk ago 4wk ago  INR 2.0 - 3.0 1.5Abnormal   2.1  1.7Abnormal   2.0 CM       Specimen Collected: 02/28/19 13:40 Last Resulted: 02/28/19 13:40     New maintenance plan:  5 mg every Mon, Fri; 10 mg all other days [] No changeStart Over  Maintenance plan weekly total: 60 mg14.3% Tablets on hand: 5 mg & 10 mg  Total dose from past 7 days: 52.5 mg    EKG 02/28/2019: Marked sinus bradycardia at the rate of 59 bpm, borderline left atrial abnormality, normal axis, no evidence of ischemia, otherwise normal EKG.  EKG 01/16/2019: Normal sinus rhythm at rate of 66 bpm, Left atrial enlargement,normal axis, Nonspecific inferior T abnormalities.  Normal QT interval.  Recommendations:   Patient is S/P minimally invasive mitral repair on 12/26/2018 and postop A. Fib. Is presently doing well and is maintaining sinus rhythm.  I have discontinued amiodarone.  INR is subtherapeutic, we will change dosage as per Coumadin clinic.  He will need repeat testing in 2 weeks.  We'll discontinue amiodarone for now and also continue metoprolol for now, he has no history of hypertension and could potentially discontinue metoprolol if he continues to maintain sinus rhythm along with discontinuing Coumadin on his next office visit in 6 weeks.  In cardiac rehabilitation he had occasional dizziness when he suddenly stands up.  But otherwise remains asymptomatic.  I'll see him back in 6 weeks.  I reviewed the results of the echocardiogram and reassured him. Adrian Prows, MD, [facility] 02/28/2019, 2:25 PM [facility] [place] Pager: [phone] Office: [phone] If no answer Cell [phone]

## 2019-03-01 NOTE — Progress Notes (Signed)
Cardiac Individual Treatment Plan  Patient Details  Name: Derrick DameWallace E Regan Jr. MRN: [id number] Date of Birth: 05/31/1961 Referring Provider:     CARDIAC REHAB PH[facility]  Referring Provider  Dr. Jacinto HalimGanji       Initial Encounter Date:    CARDIAC REHAB PHASE II [facility]ATION [facility]  Date  01/30/19      Visit Diagnosis: S/P mitral valve repair 12/26/18  Patient's Home Medications on Admission:  Current Outpatient Medications:  .  aspirin EC 81 MG EC tablet, Take 1 tablet (81 mg total) by mouth daily., Disp:  , Rfl:  .  latanoprost (XALATAN) 0.005 % ophthalmic solution, Place 1 drop into both eyes at bedtime., Disp: , Rfl:  .  metoprolol tartrate (LOPRESSOR) 25 MG tablet, Take 0.5 tablets (12.5 mg total) by mouth 2 (two) times daily., Disp: 30 tablet, Rfl: 1 .  timolol (TIMOPTIC) 0.5 % ophthalmic solution, Place 1 drop into the left eye daily. , Disp: , Rfl:  .  warfarin (COUMADIN) 10 MG tablet, Take 1 tablet (10 mg total) by mouth one time only at 6 PM for 60 doses. As directed by coumadin clinic, Disp: 30 tablet, Rfl: 1 .  warfarin (COUMADIN) 5 MG tablet, Take 1 tablet (5 mg total) by mouth daily at 6 PM. As directed by coumadin clinic, Disp: 30 tablet, Rfl: 1  Past Medical History: Past Medical History:  Diagnosis Date  . Atrial fibrillation (HCC) 02/28/2019  . Glaucoma   . Heart valve problem   . History of kidney stones   . MVP (mitral valve prolapse)   . S/P minimally-invasive mitral valve repair 12/26/2018   Complex valvuloplasty including artificial Gore-tex neochord placement x10 and 36 mm Sorin Memo 4D ring annuloplasty via right mini thoracotomy approach  . Severe mitral valve regurgitation     Tobacco Use: Social History   Tobacco Use  Smoking Status Never Smoker  Smokeless Tobacco Never Used    Labs: Recent Review Flowsheet Data    Labs  for ITP Cardiac and Pulmonary Rehab Latest Ref Rng & Units 12/22/2018 12/26/2018 12/26/2018 12/26/2018 12/26/2018   Hemoglobin A1c 4.8 - 5.6 % 5.2 - - - -   PHART 7.350 - 7.450 7.395 7.336(L) 7.356 - -   PCO2ART 32.0 - 48.0 mmHg 40.6 49.4(H) 44.0 - -   HCO3 20.0 - 28.0 mmol/L 24.3 26.4 24.7 24.9 -   TCO2 22 - 32 mmol/L - 28 26 26 24    ACIDBASEDEF 0.0 - 2.0 mmol/L - - 1.0 2.0 -   O2SAT % 98.0 100.0 100.0 98.0 -      Capillary Blood Glucose: Lab Results  Component Value Date   GLUCAP 175 (H) 12/28/2018   GLUCAP 98 12/28/2018   GLUCAP 99 12/27/2018   GLUCAP 110 (H) 12/27/2018   GLUCAP 84 12/27/2018     Exercise Target Goals: Exercise Program Goal: Individual exercise prescription set using results from initial 6 min walk test and THRR while considering  patient's activity barriers and safety.   Exercise Prescription Goal: Starting with aerobic activity 30 plus minutes a day, 3 days per week for initial exercise prescription. Provide home exercise prescription and guidelines that participant acknowledges understanding prior to discharge.  Activity Barriers & Risk Stratification: Activity Barriers & Cardiac Risk Stratification - 01/30/19 1158      Activity Barriers & Cardiac Risk Stratification   Activity Barriers  None  Cardiac Risk Stratification  High       6 Minute Walk: 6 Minute Walk    Row Name 01/30/19 1158         6 Minute Walk   Phase  Initial     Distance  1683 feet     Walk Time  6 minutes     # of Rest Breaks  0     MPH  3.1     METS  4.2     RPE  9     Perceived Dyspnea   0     VO2 Peak  14.91     Symptoms  No     Resting HR  66 bpm     Resting BP  104/70     Resting Oxygen Saturation   100 %     Exercise Oxygen Saturation  during 6 min walk  100 %     Max Ex. HR  74 bpm     Max Ex. BP  112/72     2 Minute Post BP  102/68        Oxygen Initial Assessment:   Oxygen Re-Evaluation:   Oxygen Discharge (Final Oxygen Re-Evaluation):   Initial  Exercise Prescription: Initial Exercise Prescription - 01/30/19 1100      Date of Initial Exercise RX and Referring Provider   Date  01/30/19    Referring Provider  Dr. Jacinto HalimGanji     Expected Discharge Date  03/16/19      Treadmill   MPH  3.5    Grade  1    Minutes  15      Recumbant Bike   Level  2.5    Watts  60.6    Minutes  15    METs  4      Prescription Details   Frequency (times per week)  3    Duration  Progress to 30 minutes of continuous aerobic without signs/symptoms of physical distress      Intensity   THRR 40-80% of Max Heartrate  66-132    Ratings of Perceived Exertion  11-13      Progression   Progression  Continue to progress workloads to maintain intensity without signs/symptoms of physical distress.      Resistance Training   Training Prescription  Yes    Weight  5 lbs.     Reps  10-15       Perform Capillary Blood Glucose checks as needed.  Exercise Prescription Changes: Exercise Prescription Changes    Row Name 02/05/19 1340 02/23/19 1400 02/26/19 1530         Response to Exercise   Blood Pressure (Admit)  98/64  102/68  112/62     Blood Pressure (Exercise)  128/70  124/72  148/72     Blood Pressure (Exit)  112/72  112/62  104/60     Heart Rate (Admit)  70 bpm  60 bpm  61 bpm     Heart Rate (Exercise)  85 bpm  106 bpm  108 bpm     Heart Rate (Exit)  67 bpm  72 bpm  71 bpm     Rating of Perceived Exertion (Exercise)  11  12  12      Symptoms  none  none  none     Duration  Continue with 30 min of aerobic exercise without signs/symptoms of physical distress.  Continue with 30 min of aerobic exercise without signs/symptoms of physical distress.  Continue with 30 min of  aerobic exercise without signs/symptoms of physical distress.     Intensity  THRR unchanged  THRR unchanged  THRR unchanged       Progression   Progression  Continue to progress workloads to maintain intensity without signs/symptoms of physical distress.  Continue to progress  workloads to maintain intensity without signs/symptoms of physical distress.  Continue to progress workloads to maintain intensity without signs/symptoms of physical distress.     Average METs  4  4.2  5.2       Resistance Training   Training Prescription  Yes  Yes  Yes     Weight  5 lbs.   6  7 lbs.      Reps  10-15  10-15  10-15     Time  10 Minutes  10 Minutes  10 Minutes       Interval Training   Interval Training  No  No  No       Treadmill   MPH  2.8  3.8  3.8     Grade  0  1  1     Minutes  15  15  15      METs  3.14  4.43  4.43       Bike   Level  2.5 Upright Sci[employer] bike  - Upright SciFit bike  -     Minutes  15  -  -     METs  4.8  -  -       Recumbant Bike   Level  -  -  -     Watts  -  -  -     Minutes  -  -  -     METs  -  -  -       NuStep   Level  -  5  5     SPM  -  85  85     Minutes  -  15  15     METs  -  6.2  6       Home Exercise Plan   Plans to continue exercise at  -  Home (comment)  Home (comment)     Frequency  -  Add 4 additional days to program exercise sessions.  Add 4 additional days to program exercise sessions.     Initial Home Exercises Provided  -  02/23/19  02/23/19        Exercise Comments: Exercise Comments    Row Name 02/05/19 1426 02/14/19 1400 02/23/19 1506 03/01/19 1418     Exercise Comments  Patient off to a good start with exercsie. Progressed workloads appropriately for first day of exercise.  Patient expressed interest in exercises to help improve balance. I reviewed the functional fitness balance exercises with patient and handout given.  Reviewed HEP with Pt. Pt was responsive and understands goals.  Unable to review METs and goals with Pt due to absence from program. Pt is tolerating exercise Rx well.       Exercise Goals and Review: Exercise Goals    Row Name 01/30/19 1201             Exercise Goals   Increase Physical Activity  Yes       Intervention  Provide advice, education, support and counseling about  physical activity/exercise needs.;Develop an individualized exercise prescription for aerobic and resistive training based on initial evaluation findings, risk stratification, comorbidities and participant's personal goals.  Expected Outcomes  Short Term: Attend rehab on a regular basis to increase amount of physical activity.;Long Term: Add in home exercise to make exercise part of routine and to increase amount of physical activity.;Long Term: Exercising regularly at least 3-5 days a week.       Increase Strength and Stamina  Yes       Intervention  Provide advice, education, support and counseling about physical activity/exercise needs.;Develop an individualized exercise prescription for aerobic and resistive training based on initial evaluation findings, risk stratification, comorbidities and participant's personal goals.       Expected Outcomes  Short Term: Increase workloads from initial exercise prescription for resistance, speed, and METs.;Short Term: Perform resistance training exercises routinely during rehab and add in resistance training at home;Long Term: Improve cardiorespiratory fitness, muscular endurance and strength as measured by increased METs and functional capacity (6MWT)       Able to understand and use rate of perceived exertion (RPE) scale  Yes       Intervention  Provide education and explanation on how to use RPE scale       Expected Outcomes  Short Term: Able to use RPE daily in rehab to express subjective intensity level;Long Term:  Able to use RPE to guide intensity level when exercising independently       Knowledge and understanding of Target Heart Rate Range (THRR)  Yes       Intervention  Provide education and explanation of THRR including how the numbers were predicted and where they are located for reference       Expected Outcomes  Short Term: Able to state/look up THRR;Long Term: Able to use THRR to govern intensity when exercising independently;Short Term: Able  to use daily as guideline for intensity in rehab       Able to check pulse independently  Yes       Intervention  Provide education and demonstration on how to check pulse in carotid and radial arteries.;Review the importance of being able to check your own pulse for safety during independent exercise       Expected Outcomes  Short Term: Able to explain why pulse checking is important during independent exercise;Long Term: Able to check pulse independently and accurately       Understanding of Exercise Prescription  Yes       Intervention  Provide education, explanation, and written materials on patient's individual exercise prescription       Expected Outcomes  Short Term: Able to explain program exercise prescription;Long Term: Able to explain home exercise prescription to exercise independently          Exercise Goals Re-Evaluation : Exercise Goals Re-Evaluation    Row Name 02/05/19 1426 02/23/19 1504 03/01/19 1417         Exercise Goal Re-Evaluation   Exercise Goals Review  Able to understand and use rate of perceived exertion (RPE) scale  Increase Physical Activity;Understanding of Exercise Prescription;Increase Strength and Stamina;Knowledge and understanding of Target Heart Rate Range (THRR);Able to understand and use rate of perceived exertion (RPE) scale;Able to check pulse independently  Increase Physical Activity;Increase Strength and Stamina;Able to understand and use rate of perceived exertion (RPE) scale;Knowledge and understanding of Target Heart Rate Range (THRR);Able to check pulse independently;Understanding of Exercise Prescription     Comments  Patient able to understand and use RPE scale appropriately. Pt tolerated exercise well without symtpoms.  Reviewed HEP with Pt. Pt is currently walking for 60 minutes 4 days per week in  addition to CR. Pt understands, THRR, RPE scale, weather precautions, warm up and cool down stretches, and exercise prescription.  Unable to review METs  and goals with Pt due to absence from program. Pt is tolerating exericse Rx well and increasing workloadds gradually. Will follow up with Pt when he returns.     Expected Outcomes  Progress workloads as tolerated to help achieve personal health and fitness goals.  Will continue to monitor and progress Pt as tolerated.  Will continue to monitor and progress Pt as tolerated.         Discharge Exercise Prescription (Final Exercise Prescription Changes): Exercise Prescription Changes - 02/26/19 1530      Response to Exercise   Blood Pressure (Admit)  112/62    Blood Pressure (Exercise)  148/72    Blood Pressure (Exit)  104/60    Heart Rate (Admit)  61 bpm    Heart Rate (Exercise)  108 bpm    Heart Rate (Exit)  71 bpm    Rating of Perceived Exertion (Exercise)  12    Symptoms  none    Duration  Continue with 30 min of aerobic exercise without signs/symptoms of physical distress.    Intensity  THRR unchanged      Progression   Progression  Continue to progress workloads to maintain intensity without signs/symptoms of physical distress.    Average METs  5.2      Resistance Training   Training Prescription  Yes    Weight  7 lbs.     Reps  10-15    Time  10 Minutes      Interval Training   Interval Training  No      Treadmill   MPH  3.8    Grade  1    Minutes  15    METs  4.43      NuStep   Level  5    SPM  85    Minutes  15    METs  6      Home Exercise Plan   Plans to continue exercise at  Home (comment)    Frequency  Add 4 additional days to program exercise sessions.    Initial Home Exercises Provided  02/23/19       Nutrition:  Target Goals: Understanding of nutrition guidelines, daily intake of sodium 1500mg , cholesterol 200mg , calories 30% from fat and 7% or less from saturated fats, daily to have 5 or more servings of fruits and vegetables.  Biometrics: Pre Biometrics - 01/30/19 1201      Pre Biometrics   Height  6' (1.829 m)    Weight  84.3 kg    Waist  Circumference  36.5 inches    Hip Circumference  40 inches    Waist to Hip Ratio  0.91 %    BMI (Calculated)  25.2    Triceps Skinfold  24 mm    % Body Fat  26.1 %    Grip Strength  49 kg    Flexibility  14 in    Single Leg Stand  12.56 seconds        Nutrition Therapy Plan and Nutrition Goals:   Nutrition Assessments:   Nutrition Goals Re-Evaluation:   Nutrition Goals Discharge (Final Nutrition Goals Re-Evaluation):   Psychosocial: Target Goals: Acknowledge presence or absence of significant depression and/or stress, maximize coping skills, provide positive support system. Participant is able to verbalize types and ability to use techniques and skills needed for reducing stress and  depression.  Initial Review & Psychosocial Screening: Initial Psych Review & Screening - 01/30/19 1047      Initial Review   Current issues with  None Identified      Family Dynamics   Good Support System?  Yes   [name] lives alone has firends and neghbors for support since his immediate family memebers are deceased     Barriers   Psychosocial barriers to participate in program  There are no identifiable barriers or psychosocial needs.      Screening Interventions   Interventions  Encouraged to exercise       Quality of Life Scores: Quality of Life - 01/30/19 1202      Quality of Life   Select  Quality of Life      Quality of Life Scores   Health/Function Pre  27.86 %    Socioeconomic Pre  29.14 %    Psych/Spiritual Pre  29.14 %    Family Pre  0 %    GLOBAL Pre  28.5 %      Scores of 19 and below usually indicate a poorer quality of life in these areas.  A difference of  2-3 points is a clinically meaningful difference.  A difference of 2-3 points in the total score of the Quality of Life Index has been associated with significant improvement in overall quality of life, self-image, physical symptoms, and general health in studies assessing change in quality of  life.  PHQ-9: Recent Review Flowsheet Data    Depression screen [facility] 2/9 01/30/2019   Decreased Interest 0   Down, Depressed, Hopeless 0   PHQ - 2 Score 0     Interpretation of Total Score  Total Score Depression Severity:  1-4 = Minimal depression, 5-9 = Mild depression, 10-14 = Moderate depression, 15-19 = Moderately severe depression, 20-27 = Severe depression   Psychosocial Evaluation and Intervention:   Psychosocial Re-Evaluation: Psychosocial Re-Evaluation    [place] Name 03/01/19 1537             Psychosocial Re-Evaluation   Current issues with  None Identified       Interventions  Encouraged to attend Cardiac Rehabilitation for the exercise       Continue Psychosocial Services   No Follow up required          Psychosocial Discharge (Final Psychosocial Re-Evaluation): Psychosocial Re-Evaluation - 03/01/19 1537      Psychosocial Re-Evaluation   Current issues with  None Identified    Interventions  Encouraged to attend Cardiac Rehabilitation for the exercise    Continue Psychosocial Services   No Follow up required       Vocational Rehabilitation: Provide vocational rehab assistance to qualifying candidates.   Vocational Rehab Evaluation & Intervention: Vocational Rehab - 01/30/19 1227      Initial Vocational Rehab Evaluation & Intervention   Assessment shows need for Vocational Rehabilitation  No       Education: Education Goals: Education classes will be provided on a weekly basis, covering required topics. Participant will state understanding/return demonstration of topics presented.  Learning Barriers/Preferences: Learning Barriers/Preferences - 01/30/19 1202      Learning Barriers/Preferences   Learning Barriers  None    Learning Preferences  Audio;Verbal Instruction;Written Material       Education Topics: Hypertension, Hypertension Reduction -Define heart disease and high blood pressure. Discus how high blood pressure affects the body and ways  to reduce high blood pressure.   Exercise and Your Heart -Discuss why it is  important to exercise, the FITT principles of exercise, normal and abnormal responses to exercise, and how to exercise safely.   Angina -Discuss definition of angina, causes of angina, treatment of angina, and how to decrease risk of having angina.   Cardiac Medications -Review what the following cardiac medications are used for, how they affect the body, and side effects that may occur when taking the medications.  Medications include Aspirin, Beta blockers, calcium channel blockers, ACE Inhibitors, angiotensin receptor blockers, diuretics, digoxin, and antihyperlipidemics.   Congestive Heart Failure -Discuss the definition of CHF, how to live with CHF, the signs and symptoms of CHF, and how keep track of weight and sodium intake.   Heart Disease and Intimacy -Discus the effect sexual activity has on the heart, how changes occur during intimacy as we age, and safety during sexual activity.   Smoking Cessation / COPD -Discuss different methods to quit smoking, the health benefits of quitting smoking, and the definition of COPD.   Nutrition I: Fats -Discuss the types of cholesterol, what cholesterol does to the heart, and how cholesterol levels can be controlled.   Nutrition II: Labels -Discuss the different components of food labels and how to read food label   Heart Parts/Heart Disease and PAD -Discuss the anatomy of the heart, the pathway of blood circulation through the heart, and these are affected by heart disease.   Stress I: Signs and Symptoms -Discuss the causes of stress, how stress may lead to anxiety and depression, and ways to limit stress.   Stress II: Relaxation -Discuss different types of relaxation techniques to limit stress.   Warning Signs of Stroke / TIA -Discuss definition of a stroke, what the signs and symptoms are of a stroke, and how to identify when someone is having  stroke.   Knowledge Questionnaire Score: Knowledge Questionnaire Score - 01/30/19 1204      Knowledge Questionnaire Score   Pre Score  21/24       Core Components/Risk Factors/Patient Goals at Admission: Personal Goals and Risk Factors at Admission - 01/30/19 1204      Core Components/Risk Factors/Patient Goals on Admission    Weight Management  Yes;Weight Maintenance    Admit Weight  185 lb 13.6 oz (84.3 kg)    Stress  Yes    Intervention  Offer individual and/or small group education and counseling on adjustment to heart disease, stress management and health-related lifestyle change. Teach and support self-help strategies.;Refer participants experiencing significant psychosocial distress to appropriate mental health specialists for further evaluation and treatment. When possible, include family members and significant others in education/counseling sessions.       Core Components/Risk Factors/Patient Goals Review:  Goals and Risk Factor Review    Row Name 03/01/19 1537             Core Components/Risk Factors/Patient Goals Review   Personal Goals Review  Weight Management/Obesity;Stress       Review  Link Snuffer is doing well with exercise. [name]'s vital signs have been stable       Expected Outcomes  [name] will continue to participate in phase 2 cardiac rehab for exercise, nutrtion and lifestyle modifications.          Core Components/Risk Factors/Patient Goals at Discharge (Final Review):  Goals and Risk Factor Review - 03/01/19 1537      Core Components/Risk Factors/Patient Goals Review   Personal Goals Review  Weight Management/Obesity;Stress    Review  Link Snuffer is doing well with exercise. [name]'s vital signs have been  stable    Expected Outcomes  Link Snuffer will continue to participate in phase 2 cardiac rehab for exercise, nutrtion and lifestyle modifications.       ITP Comments: ITP Comments    Row Name 01/30/19 1046 03/01/19 1536         ITP Comments  Dr Armanda Magic, Medical Director  30 Day ITP Review. Patient with good participation and attendance in phase 2 cardiac rehab.         Comments: See ITP comments.Gladstone Lighter, RN,BSN 03/01/2019 3:42 PM

## 2019-03-02 ENCOUNTER — Encounter (HOSPITAL_COMMUNITY)
Admission: RE | Admit: 2019-03-02 | Discharge: 2019-03-02 | Disposition: A | Discharge status: Home / Self Care | Payer: Federal, State, Local not specified - PPO | Source: Ambulatory Visit | Attending: Cardiology | Admitting: Cardiology

## 2019-03-02 ENCOUNTER — Other Ambulatory Visit: Payer: Self-pay

## 2019-03-02 DIAGNOSIS — Z9889 Other specified postprocedural states: Secondary | ICD-10-CM | POA: Diagnosis not present

## 2019-03-05 ENCOUNTER — Encounter (HOSPITAL_COMMUNITY)
Admission: RE | Admit: 2019-03-05 | Discharge: 2019-03-05 | Disposition: A | Discharge status: Home / Self Care | Payer: Federal, State, Local not specified - PPO | Source: Ambulatory Visit | Attending: Cardiology | Admitting: Cardiology

## 2019-03-05 ENCOUNTER — Other Ambulatory Visit: Payer: Self-pay

## 2019-03-05 DIAGNOSIS — Z9889 Other specified postprocedural states: Secondary | ICD-10-CM

## 2019-03-06 ENCOUNTER — Other Ambulatory Visit: Payer: Self-pay | Admitting: Cardiology

## 2019-03-07 ENCOUNTER — Other Ambulatory Visit: Payer: Self-pay

## 2019-03-07 ENCOUNTER — Encounter (HOSPITAL_COMMUNITY)
Admission: RE | Admit: 2019-03-07 | Discharge: 2019-03-07 | Disposition: A | Discharge status: Home / Self Care | Payer: Federal, State, Local not specified - PPO | Source: Ambulatory Visit | Attending: Cardiology | Admitting: Cardiology

## 2019-03-07 DIAGNOSIS — Z9889 Other specified postprocedural states: Secondary | ICD-10-CM

## 2019-03-09 ENCOUNTER — Other Ambulatory Visit: Payer: Self-pay

## 2019-03-09 ENCOUNTER — Encounter (HOSPITAL_COMMUNITY)
Admission: RE | Admit: 2019-03-09 | Discharge: 2019-03-09 | Disposition: A | Discharge status: Home / Self Care | Payer: Federal, State, Local not specified - PPO | Source: Ambulatory Visit | Attending: Cardiology | Admitting: Cardiology

## 2019-03-09 VITALS — Ht 72.0 in | Wt 185.8 lb

## 2019-03-09 DIAGNOSIS — Z9889 Other specified postprocedural states: Secondary | ICD-10-CM | POA: Diagnosis not present

## 2019-03-12 ENCOUNTER — Other Ambulatory Visit: Payer: Self-pay

## 2019-03-12 ENCOUNTER — Encounter (HOSPITAL_COMMUNITY)
Admission: RE | Admit: 2019-03-12 | Discharge: 2019-03-12 | Disposition: A | Discharge status: Home / Self Care | Payer: Federal, State, Local not specified - PPO | Source: Ambulatory Visit | Attending: Cardiology | Admitting: Cardiology

## 2019-03-12 DIAGNOSIS — Z9889 Other specified postprocedural states: Secondary | ICD-10-CM | POA: Diagnosis not present

## 2019-03-14 ENCOUNTER — Encounter (HOSPITAL_COMMUNITY)
Admission: RE | Admit: 2019-03-14 | Discharge: 2019-03-14 | Disposition: A | Discharge status: Home / Self Care | Payer: Federal, State, Local not specified - PPO | Source: Ambulatory Visit | Attending: Cardiology | Admitting: Cardiology

## 2019-03-14 ENCOUNTER — Other Ambulatory Visit: Payer: Self-pay

## 2019-03-14 DIAGNOSIS — Z9889 Other specified postprocedural states: Secondary | ICD-10-CM | POA: Diagnosis not present

## 2019-03-15 ENCOUNTER — Ambulatory Visit (INDEPENDENT_AMBULATORY_CARE_PROVIDER_SITE_OTHER): Payer: Federal, State, Local not specified - PPO | Admitting: Cardiology

## 2019-03-15 DIAGNOSIS — I480 Paroxysmal atrial fibrillation: Secondary | ICD-10-CM

## 2019-03-15 DIAGNOSIS — Z9889 Other specified postprocedural states: Secondary | ICD-10-CM | POA: Diagnosis not present

## 2019-03-15 DIAGNOSIS — Z7901 Long term (current) use of anticoagulants: Secondary | ICD-10-CM | POA: Diagnosis not present

## 2019-03-15 DIAGNOSIS — Z5181 Encounter for therapeutic drug level monitoring: Secondary | ICD-10-CM

## 2019-03-15 LAB — POCT INR: INR: 2.5 (ref 2.0–3.0)

## 2019-03-15 NOTE — Progress Notes (Signed)
Warfarin Anticoagulation Tracking    Current maintenance plan:  5 mg every Mon, Fri; 10 mg all other days [] No changeStart Over  Maintenance plan weekly total: 60 mg Tablets on hand: 5 mg & 10 mg  Total dose from past 7 days: 60 mg    INR within range, no changes made today. Will repeat in 2 weeks as his last INR was 1.5.

## 2019-03-16 ENCOUNTER — Other Ambulatory Visit: Payer: Self-pay

## 2019-03-16 ENCOUNTER — Encounter (HOSPITAL_COMMUNITY)
Admission: RE | Admit: 2019-03-16 | Discharge: 2019-03-16 | Disposition: A | Discharge status: Home / Self Care | Payer: Federal, State, Local not specified - PPO | Source: Ambulatory Visit | Attending: Cardiology | Admitting: Cardiology

## 2019-03-16 DIAGNOSIS — Z9889 Other specified postprocedural states: Secondary | ICD-10-CM

## 2019-03-16 NOTE — Progress Notes (Signed)
Discharge Progress Report  Patient Details  Name: Derrick Craig. MRN: [id number] Date of Birth: 06/08/1961 Referring Provider:     CARDIAC REHAB PHASE II ORIENTATION from 01/30/2019 in [place]  Referring Provider  Dr. Einar Gip        Number of Visits: 17  Reason for Discharge:  Patient reached a stable level of exercise. Patient independent in their exercise. Patient has met program and personal goals.  Smoking History:  Social History   Tobacco Use  Smoking Status Never Smoker  Smokeless Tobacco Never Used    Diagnosis:  S/P mitral valve repair 12/26/18  ADL UCSD:   Initial Exercise Prescription: Initial Exercise Prescription - 01/30/19 1100      Date of Initial Exercise RX and Referring Provider   Date  01/30/19    Referring Provider  Dr. Einar Gip     Expected Discharge Date  03/16/19      Treadmill   MPH  3.5    Grade  1    Minutes  15      Recumbant Bike   Level  2.5    Watts  60.6    Minutes  15    METs  4      Prescription Details   Frequency (times per week)  3    Duration  Progress to 30 minutes of continuous aerobic without signs/symptoms of physical distress      Intensity   THRR 40-80% of Max Heartrate  66-132    Ratings of Perceived Exertion  11-13      Progression   Progression  Continue to progress workloads to maintain intensity without signs/symptoms of physical distress.      Resistance Training   Training Prescription  Yes    Weight  5 lbs.     Reps  10-15       Discharge Exercise Prescription (Final Exercise Prescription Changes): Exercise Prescription Changes - 03/16/19 1500      Response to Exercise   Blood Pressure (Admit)  98/62    Blood Pressure (Exercise)  144/68    Blood Pressure (Exit)  104/62    Heart Rate (Admit)  63 bpm    Heart Rate (Exercise)  117 bpm    Heart Rate (Exit)  61 bpm    Rating of Perceived Exertion (Exercise)  13    Symptoms  none    Comments  Pt completed  CR program today.     Duration  Continue with 30 min of aerobic exercise without signs/symptoms of physical distress.    Intensity  THRR unchanged      Progression   Progression  Continue to progress workloads to maintain intensity without signs/symptoms of physical distress.    Average METs  6.1      Resistance Training   Training Prescription  Yes    Weight  7 lbs.     Reps  10-15    Time  10 Minutes      Interval Training   Interval Training  No      Treadmill   MPH  4    Grade  2    Minutes  15    METs  5.17      NuStep   Level  7    SPM  85    Minutes  15    METs  7      Home Exercise Plan   Plans to continue exercise at  Home (comment)  Frequency  Add 4 additional days to program exercise sessions.    Initial Home Exercises Provided  02/23/19       Functional Capacity: 6 Minute Walk    Row Name 01/30/19 1158 03/09/19 1515       6 Minute Walk   Phase  Initial  Discharge    Distance  1683 feet  1949 feet    Distance % Change  -  15.81 %    Distance Feet Change  -  266 ft    Walk Time  6 minutes  6 minutes    # of Rest Breaks  0  0    MPH  3.1  3.6    METS  4.2  4.7    RPE  9  11    Perceived Dyspnea   0  0    VO2 Peak  14.91  16.74    Symptoms  No  No    Resting HR  66 bpm  56 bpm    Resting BP  104/70  104/62    Resting Oxygen Saturation   100 %  -    Exercise Oxygen Saturation  during 6 min walk  100 %  -    Max Ex. HR  74 bpm  80 bpm    Max Ex. BP  112/72  112/62    2 Minute Post BP  102/68  96/60       Psychological, QOL, Others - Outcomes: PHQ 2/9: Depression screen [facility] 2/9 03/16/2019 01/30/2019  Decreased Interest 0 0  Down, Depressed, Hopeless 0 0  PHQ - 2 Score 0 0    Quality of Life: Quality of Life - 03/16/19 1515      Quality of Life   Select  Quality of Life      Quality of Life Scores   Health/Function Post  27.86 %    Socioeconomic Post  29.14 %    Psych/Spiritual Post  30 %    Family Post  0 %    GLOBAL Post  28.71 %        Personal Goals: Goals established at orientation with interventions provided to work toward goal. Personal Goals and Risk Factors at Admission - 01/30/19 1204      Core Components/Risk Factors/Patient Goals on Admission    Weight Management  Yes;Weight Maintenance    Admit Weight  185 lb 13.6 oz (84.3 kg)    Stress  Yes    Intervention  Offer individual and/or small group education and counseling on adjustment to heart disease, stress management and health-related lifestyle change. Teach and support self-help strategies.;Refer participants experiencing significant psychosocial distress to appropriate mental health specialists for further evaluation and treatment. When possible, include family members and significant others in education/counseling sessions.        Personal Goals Discharge: Goals and Risk Factor Review    Row Name 03/01/19 1537 03/16/19 1350           Core Components/Risk Factors/Patient Goals Review   Personal Goals Review  Weight Management/Obesity;Stress  Weight Management/Obesity;Stress      Review  Derrick Craig is doing well with exercise. Derrick Craig's vital signs have been stable  [employer] today from [place]. He plans to continue his exercise plan by walking daily at home. He also plans to incorporate bands into his strength training.      Expected Outcomes  Derrick Craig will continue to participate in phase 2 cardiac rehab for exercise, nutrtion and lifestyle modifications.  Derrick Craig will continue  to exercise after graduation from [place] and will continue his lifestyle modifications and healthy nutrition         Exercise Goals and Review: Exercise Goals    Row Name 01/30/19 1201             Exercise Goals   Increase Physical Activity  Yes       Intervention  Provide advice, education, support and counseling about physical activity/exercise needs.;Develop an individualized exercise prescription for aerobic and resistive training based on initial evaluation findings, risk  stratification, comorbidities and participant's personal goals.       Expected Outcomes  Short Term: Attend rehab on a regular basis to increase amount of physical activity.;Long Term: Add in home exercise to make exercise part of routine and to increase amount of physical activity.;Long Term: Exercising regularly at least 3-5 days a week.       Increase Strength and Stamina  Yes       Intervention  Provide advice, education, support and counseling about physical activity/exercise needs.;Develop an individualized exercise prescription for aerobic and resistive training based on initial evaluation findings, risk stratification, comorbidities and participant's personal goals.       Expected Outcomes  Short Term: Increase workloads from initial exercise prescription for resistance, speed, and METs.;Short Term: Perform resistance training exercises routinely during rehab and add in resistance training at home;Long Term: Improve cardiorespiratory fitness, muscular endurance and strength as measured by increased METs and functional capacity (6MWT)       Able to understand and use rate of perceived exertion (RPE) scale  Yes       Intervention  Provide education and explanation on how to use RPE scale       Expected Outcomes  Short Term: Able to use RPE daily in rehab to express subjective intensity level;Long Term:  Able to use RPE to guide intensity level when exercising independently       Knowledge and understanding of Target Heart Rate Range (THRR)  Yes       Intervention  Provide education and explanation of THRR including how the numbers were predicted and where they are located for reference       Expected Outcomes  Short Term: Able to state/look up THRR;Long Term: Able to use THRR to govern intensity when exercising independently;Short Term: Able to use daily as guideline for intensity in rehab       Able to check pulse independently  Yes       Intervention  Provide education and demonstration on how to  check pulse in carotid and radial arteries.;Review the importance of being able to check your own pulse for safety during independent exercise       Expected Outcomes  Short Term: Able to explain why pulse checking is important during independent exercise;Long Term: Able to check pulse independently and accurately       Understanding of Exercise Prescription  Yes       Intervention  Provide education, explanation, and written materials on patient's individual exercise prescription       Expected Outcomes  Short Term: Able to explain program exercise prescription;Long Term: Able to explain home exercise prescription to exercise independently          Exercise Goals Re-Evaluation: Exercise Goals Re-Evaluation    Row Name 02/05/19 1426 02/23/19 1504 03/01/19 1417 03/16/19 1517       Exercise Goal Re-Evaluation   Exercise Goals Review  Able to understand and use rate of perceived exertion (RPE) scale  Increase Physical Activity;Understanding of Exercise Prescription;Increase Strength and Stamina;Knowledge and understanding of Target Heart Rate Range (THRR);Able to understand and use rate of perceived exertion (RPE) scale;Able to check pulse independently  Increase Physical Activity;Increase Strength and Stamina;Able to understand and use rate of perceived exertion (RPE) scale;Knowledge and understanding of Target Heart Rate Range (THRR);Able to check pulse independently;Understanding of Exercise Prescription  Increase Physical Activity;Increase Strength and Stamina;Able to understand and use rate of perceived exertion (RPE) scale;Knowledge and understanding of Target Heart Rate Range (THRR);Able to check pulse independently;Understanding of Exercise Prescription    Comments  Patient able to understand and use RPE scale appropriately. Pt tolerated exercise well without symtpoms.  Reviewed HEP with Pt. Pt is currently walking for 60 minutes 4 days per week in addition to CR. Pt understands, THRR, RPE scale,  weather precautions, warm up and cool down stretches, and exercise prescription.  Unable to review METs and goals with Pt due to absence from program. Pt is tolerating exericse Rx well and increasing workloadds gradually. Will follow up with Pt when he returns.  Pt completed CR program today. Pt tolerated exercise Rx well and progressed well in the program ending with an average MET level of 6.1 Pt states he is going to continue exercising at home by wlaking 30-45 minutes, using an airdyne bike, and using weights. Pt is interested in the virtual CR program.    Expected Outcomes  Progress workloads as tolerated to help achieve personal health and fitness goals.  Will continue to monitor and progress Pt as tolerated.  Will continue to monitor and progress Pt as tolerated.  Pt will continue exercising at home and use the virtual CR program.       Nutrition & Weight - Outcomes: Pre Biometrics - 01/30/19 1201      Pre Biometrics   Height  6' (1.829 m)    Weight  185 lb 13.6 oz (84.3 kg)    Waist Circumference  36.5 inches    Hip Circumference  40 inches    Waist to Hip Ratio  0.91 %    BMI (Calculated)  25.2    Triceps Skinfold  24 mm    % Body Fat  26.1 %    Grip Strength  49 kg    Flexibility  14 in    Single Leg Stand  12.56 seconds      Post Biometrics - 03/09/19 1516       Post  Biometrics   Height  6' (1.829 m)    Weight  185 lb 13.6 oz (84.3 kg)    Waist Circumference  36 inches    Hip Circumference  40 inches    Waist to Hip Ratio  0.9 %    BMI (Calculated)  25.2    Triceps Skinfold  24 mm    % Body Fat  25.8 %    Grip Strength  51 kg    Flexibility  13.5 in    Single Leg Stand  30 seconds       Nutrition:   Nutrition Discharge:   Education Questionnaire Score: Knowledge Questionnaire Score - 03/16/19 1515      Knowledge Questionnaire Score   Post Score  20/24       Goals reviewed with patient; copy given to patient. Pt graduated from cardiac rehab program  today with completion of 17 exercise sessions in Phase II. Pt maintained good attendance and progressed nicely during his participation in rehab as evidenced by increased MET level.  Medication list reconciled. Repeat  PHQ score-0. Pt has made significant lifestyle changes and should be commended for his success. Pt feels he has achieved his goals during cardiac rehab.

## 2019-03-23 ENCOUNTER — Other Ambulatory Visit: Payer: Self-pay | Admitting: Thoracic Surgery (Cardiothoracic Vascular Surgery)

## 2019-03-23 DIAGNOSIS — Z9889 Other specified postprocedural states: Secondary | ICD-10-CM

## 2019-03-26 ENCOUNTER — Other Ambulatory Visit: Payer: Self-pay

## 2019-03-26 ENCOUNTER — Encounter: Payer: Self-pay | Admitting: Thoracic Surgery (Cardiothoracic Vascular Surgery)

## 2019-03-26 ENCOUNTER — Encounter (HOSPITAL_COMMUNITY)
Admission: RE | Admit: 2019-03-26 | Discharge: 2019-03-26 | Disposition: A | Discharge status: Home / Self Care | Payer: Federal, State, Local not specified - PPO | Source: Ambulatory Visit | Attending: Cardiology | Admitting: Cardiology

## 2019-03-26 ENCOUNTER — Ambulatory Visit
Admission: RE | Admit: 2019-03-26 | Discharge: 2019-03-26 | Disposition: A | Discharge status: Home / Self Care | Payer: Federal, State, Local not specified - PPO | Source: Ambulatory Visit | Attending: Thoracic Surgery (Cardiothoracic Vascular Surgery) | Admitting: Thoracic Surgery (Cardiothoracic Vascular Surgery)

## 2019-03-26 ENCOUNTER — Ambulatory Visit (INDEPENDENT_AMBULATORY_CARE_PROVIDER_SITE_OTHER): Payer: Self-pay | Admitting: Thoracic Surgery (Cardiothoracic Vascular Surgery)

## 2019-03-26 VITALS — BP 115/72 | HR 56 | Temp 97.7°F | Resp 16 | Ht 72.0 in | Wt 180.0 lb

## 2019-03-26 DIAGNOSIS — Z9889 Other specified postprocedural states: Secondary | ICD-10-CM

## 2019-03-26 DIAGNOSIS — I340 Nonrheumatic mitral (valve) insufficiency: Secondary | ICD-10-CM

## 2019-03-26 DIAGNOSIS — I341 Nonrheumatic mitral (valve) prolapse: Secondary | ICD-10-CM

## 2019-03-26 DIAGNOSIS — Z952 Presence of prosthetic heart valve: Secondary | ICD-10-CM | POA: Diagnosis not present

## 2019-03-26 NOTE — Progress Notes (Signed)
[address]       [place] [id number]             [phone]     CARDIOTHORACIC SURGERY OFFICE NOTE  Referring Provider is Yates DecampGanji, Jay, MD PCP is Pearson GrippeKim, James, MD   HPI:  Patient is a 58 year old male who returns to the office today for routine follow-up status post minimally invasive mitral valve repair on December 26, 2018 for mitral valve prolapse with severe symptomatic primary mitral regurgitation.  The patient's early postoperative coverage was uneventful.  He was last seen here in our office on January 22, 2019 at which time he was doing well.  He underwent routine follow-up transthoracic echocardiogram at Dr. Verl DickerGanji's office on February 02, 2019.  Left ventricular function was felt to be normal with ejection fraction estimated 55%.  There was intact mitral valve repair with no residual mitral regurgitation.  Mean transvalvular gradient across the mitral valve was estimated 1.4 mmHg and mitral valve area reported 1.8 cm by pressure half-time.  Patient returns for office today and reports that he is doing quite well.  He is exercising on a regular basis.  He reports minimal residual soreness along the medial aspect of his right anterior chest related to his incision.  He states that he feels as though his pulse is good and regular and his breathing is excellent, noticeably better than it was prior to surgery.  He is pleased with his progress.   Current Outpatient Medications  Medication Sig Dispense Refill  . aspirin EC 81 MG EC tablet Take 1 tablet (81 mg total) by mouth daily.    Marland Kitchen. latanoprost (XALATAN) 0.005 % ophthalmic solution Place 1 drop into both eyes at bedtime.    . metoprolol tartrate (LOPRESSOR) 25 MG tablet Take 0.5 tablets (12.5 mg total) by mouth 2 (two) times daily. 30 tablet 1  . timolol (TIMOPTIC) 0.5 % ophthalmic solution Place 1 drop into the left eye daily.     Marland Kitchen. warfarin (COUMADIN) 10 MG tablet Take 1 tablet (10 mg total) by mouth one time only at 6 PM  for 60 doses. As directed by coumadin clinic 30 tablet 1  . warfarin (COUMADIN) 5 MG tablet Take 1 tablet (5 mg total) by mouth daily at 6 PM. As directed by coumadin clinic 30 tablet 1   No current facility-administered medications for this visit.       Physical Exam:   BP 115/72 (BP Location: Left Arm, Patient Position: Sitting, Cuff Size: Normal)   Pulse (!) 56   Temp 97.7 F (36.5 C)   Resp 16   Ht 6' (1.829 m)   Wt 180 lb (81.6 kg)   SpO2 98% Comment: RA  BMI 24.41 kg/m   General:  Well-appearing  Chest:   Clear to auscultation  CV:   Regular rate and rhythm without murmur  Incisions:  Completely healed  Abdomen:  Soft nontender  Extremities:  Warm and well-perfused  Diagnostic Tests:  TRANSTHORACIC ECHOCARDIOGRAM  Report from transthoracic echocardiogram performed February 02, 2019 is reviewed.  Normal left ventricular systolic function with ejection fraction estimated 55%.  Intact mitral valve repair with no residual mitral regurgitation.  Mean transvalvular gradient across the mitral valve estimated 1.4 mmHg with mitral valve area reported 1.8 cm.  No other significant abnormalities were noted.    CHEST - 2 VIEW  COMPARISON:  Two-view chest x-ray 01/22/2019  FINDINGS: The heart size is normal. Mitral valve annular repair  is again noted. There is no edema or effusion. No focal airspace disease is present.  IMPRESSION: 1. Normal two-view chest x-ray following mitral valve repair.   Electronically Signed   By: San Morelle M.D.   On: 03/26/2019 14:10   Impression:  Patient is doing very well approximately 3 months status post minimally invasive mitral valve repair  Plan:  I have instructed the patient that he may stop taking Coumadin.  He should continue on aspirin 81 mg daily.  He was instructed to remain on all other medications without change.  Patient may continue to increase his activity without any particular limitations.  He may return  to work without any limitations.  The patient has been reminded regarding the importance of dental hygiene and the lifelong need for antibiotic prophylaxis for all dental cleanings and other related invasive procedures.  Patient will continue to follow-up with Dr. Einar Gip and return to our office for routine follow-up next June, approximately 1 year following his surgery.   I spent in excess of 15 minutes during the conduct of this office consultation and >50% of this time involved direct face-to-face encounter with the patient for counseling and/or coordination of their care.   Valentina Gu. Roxy Manns, MD 03/26/2019 2:47 PM

## 2019-03-26 NOTE — Progress Notes (Signed)
         Confirm Consent - In the setting of the current Covid19 crisis, you are scheduled for a phone visit with your Cardiac or Pulmonary team member.  Just as we do with many in-gym visits, in order for you to participate in this visit, we must obtain consent.  If you'd like, I can send this to your mychart (if signed up) or email for you to review.  Otherwise, I can obtain your verbal consent now.  By agreeing to a telephone visit, we'd like you to understand that the technology does not allow for your Cardiac or Pulmonary Rehab team member to perform a physical assessment, and thus may limit their ability to fully assess your ability to perform exercise programs. If your provider identifies any concerns that need to be evaluated in person, we will make arrangements to do so.  Finally, though the technology is pretty good, we cannot assure that it will always work on either your or our end and we cannot ensure that we have a secure connection.  Cardiac and Pulmonary Rehab Telehealth visits and "At Home" cardiac and pulmonary rehab are provided at no cost to you.        Are you willing to proceed?"        STAFF: Did the patient verbally acknowledge consent to telehealth visit? Document YES/NO here: Yes     Noel Christmas  Cardiac and Pulmonary Rehab Staff        Date 03/26/2019      Time 9:43 AM

## 2019-03-26 NOTE — Progress Notes (Signed)
Spoke to pt regarding Virtual Cardiac  and Pulmonary Rehab.  Pt  was able to download the [employer] app on their smart device with no issues. Pt set up their account and received the following welcome message -"Welcome to the [facility] program. We hope that you will find the exercise program beneficial in your recovery process. Our staff is available to assist with any questions/concerns about your exercise routine. Best wishes". Brief orientation provided to with the advisement to watch the "Intro to Rehab" series located under the Resource tab. Pt verbalized understanding. Will continue to follow and monitor pt progress with feedback as needed.  

## 2019-03-26 NOTE — Patient Instructions (Signed)
You may stop taking Coumadin (warfarin)  Continue all other previous medications without any changes at this time  You may resume unrestricted physical activity without any particular limitations at this time.  Endocarditis is a potentially serious infection of heart valves or inside lining of the heart.  It occurs more commonly in patients with diseased heart valves (such as patient's with aortic or mitral valve disease) and in patients who have undergone heart valve repair or replacement.  Certain surgical and dental procedures may put you at risk, such as dental cleaning, other dental procedures, or any surgery involving the respiratory, urinary, gastrointestinal tract, gallbladder or prostate gland.   To minimize your chances for develooping endocarditis, maintain good oral health and seek prompt medical attention for any infections involving the mouth, teeth, gums, skin or urinary tract.    Always notify your doctor or dentist about your underlying heart valve condition before having any invasive procedures. You will need to take antibiotics before certain procedures, including all routine dental cleanings or other dental procedures.  Your cardiologist or dentist should prescribe these antibiotics for you to be taken ahead of time.        

## 2019-03-29 ENCOUNTER — Encounter: Payer: Self-pay | Admitting: Cardiology

## 2019-03-29 ENCOUNTER — Ambulatory Visit (INDEPENDENT_AMBULATORY_CARE_PROVIDER_SITE_OTHER): Payer: Federal, State, Local not specified - PPO | Admitting: Cardiology

## 2019-03-29 ENCOUNTER — Other Ambulatory Visit: Payer: Self-pay

## 2019-03-29 DIAGNOSIS — N401 Enlarged prostate with lower urinary tract symptoms: Secondary | ICD-10-CM | POA: Diagnosis not present

## 2019-03-29 DIAGNOSIS — Z5181 Encounter for therapeutic drug level monitoring: Secondary | ICD-10-CM

## 2019-03-29 DIAGNOSIS — Z701 Counseling related to patient's sexual behavior and orientation: Secondary | ICD-10-CM

## 2019-03-29 DIAGNOSIS — N200 Calculus of kidney: Secondary | ICD-10-CM | POA: Diagnosis not present

## 2019-03-29 DIAGNOSIS — Z9889 Other specified postprocedural states: Secondary | ICD-10-CM | POA: Diagnosis not present

## 2019-03-29 DIAGNOSIS — R3911 Hesitancy of micturition: Secondary | ICD-10-CM | POA: Diagnosis not present

## 2019-03-29 LAB — POCT INR: INR: 2.1 (ref 2.0–3.0)

## 2019-03-29 NOTE — Progress Notes (Signed)
INR 2.0 - 3.0 2.1  2.5  1.5Abnormal        Specimen Collected: 03/29/19 11:18  Last Resulted: 03/29/19 11:18     New maintenance plan:  No maintenance plan [] No changeStart Over  Maintenance plan weekly total: N/A Tablets on hand: None  Total dose from past 7 days: 60 mg     I saw the patient today, he is presently doing well and remains asymptomatic.  I also reviewed the notes from Dr. Darylene Price who recommends discontinuing Coumadin.    I will start the patient on aspirin 81 mg daily, patient is maintaining sinus rhythm on auscultation, heart rate appears to be very regular.  Also he has very little cardioembolic risk.

## 2019-04-02 DIAGNOSIS — L570 Actinic keratosis: Secondary | ICD-10-CM | POA: Diagnosis not present

## 2019-04-02 DIAGNOSIS — B078 Other viral warts: Secondary | ICD-10-CM | POA: Diagnosis not present

## 2019-04-02 DIAGNOSIS — L821 Other seborrheic keratosis: Secondary | ICD-10-CM | POA: Diagnosis not present

## 2019-04-02 DIAGNOSIS — H401232 Low-tension glaucoma, bilateral, moderate stage: Secondary | ICD-10-CM | POA: Diagnosis not present

## 2019-04-02 DIAGNOSIS — H2513 Age-related nuclear cataract, bilateral: Secondary | ICD-10-CM | POA: Diagnosis not present

## 2019-04-02 DIAGNOSIS — H43813 Vitreous degeneration, bilateral: Secondary | ICD-10-CM | POA: Diagnosis not present

## 2019-04-02 DIAGNOSIS — L918 Other hypertrophic disorders of the skin: Secondary | ICD-10-CM | POA: Diagnosis not present

## 2019-04-02 DIAGNOSIS — Z85828 Personal history of other malignant neoplasm of skin: Secondary | ICD-10-CM | POA: Diagnosis not present

## 2019-04-10 ENCOUNTER — Telehealth (HOSPITAL_COMMUNITY): Payer: Self-pay

## 2019-04-10 ENCOUNTER — Encounter (HOSPITAL_COMMUNITY)
Admission: RE | Admit: 2019-04-10 | Discharge: 2019-04-10 | Disposition: A | Discharge status: Home / Self Care | Payer: Federal, State, Local not specified - PPO | Source: Ambulatory Visit | Attending: Cardiology | Admitting: Cardiology

## 2019-04-10 NOTE — Telephone Encounter (Signed)
Pt returned phone call about inactivity in virtual CR app. Pt stated he has went back to work and is trying to find time to exercise. Pt stated he would start to develop a routine and start logging in the virtual CR app.

## 2019-04-10 NOTE — Telephone Encounter (Signed)
Phone call to Pt to inquire about inactivity in the virtual CR app. Pt did not answer. Message was left for Pt to call back. Pt will be terminated from app if no response by 04/24/19.

## 2019-04-12 ENCOUNTER — Ambulatory Visit (INDEPENDENT_AMBULATORY_CARE_PROVIDER_SITE_OTHER): Payer: Federal, State, Local not specified - PPO | Admitting: Cardiology

## 2019-04-12 ENCOUNTER — Other Ambulatory Visit: Payer: Self-pay

## 2019-04-12 ENCOUNTER — Encounter: Payer: Self-pay | Admitting: Cardiology

## 2019-04-12 VITALS — BP 105/74 | HR 59 | Temp 98.0°F | Ht 72.0 in | Wt 185.0 lb

## 2019-04-12 DIAGNOSIS — I480 Paroxysmal atrial fibrillation: Secondary | ICD-10-CM | POA: Diagnosis not present

## 2019-04-12 DIAGNOSIS — I341 Nonrheumatic mitral (valve) prolapse: Secondary | ICD-10-CM | POA: Diagnosis not present

## 2019-04-12 DIAGNOSIS — Z9889 Other specified postprocedural states: Secondary | ICD-10-CM

## 2019-04-12 NOTE — Progress Notes (Signed)
Primary Physician/Referring:  Pearson Grippe, MD  Patient ID: Derrick Dame., male    DOB: 1961-07-02, 58 y.o.   MRN: [id number]  Chief Complaint  Patient presents with  . Atrial Fibrillation  . MV Repair   HPI: Derrick Craig.  is a 58 y.o. male  Caucasian male with history of mitral valve prolapse and moderate to severe MR, underwent minimally invasive mitral valve repair on 12/26/2018 by Dr. Tressie Stalker. He was placed on amiodarone and also Coumadin for postoperative atrial fibrillation.  Amiodarone was discontinued 2 months ago and 2 weeks ago I discontinued Coumadin. He is presently asymptomatic.   Past Medical History:  Diagnosis Date  . Atrial fibrillation (HCC) 02/28/2019  . Glaucoma   . Heart valve problem   . History of kidney stones   . MVP (mitral valve prolapse)   . S/P minimally-invasive mitral valve repair 12/26/2018   Complex valvuloplasty including artificial Gore-tex neochord placement x10 and 36 mm Sorin Memo 4D ring annuloplasty via right mini thoracotomy approach  . Severe mitral valve regurgitation     Past Surgical History:  Procedure Laterality Date  . CARDIAC CATHETERIZATION    . MITRAL VALVE REPAIR Right 12/26/2018   Procedure: MINIMALLY INVASIVE MITRAL VALVE REPAIR (MVR) using Memo 4D Ring Size 78mm;  Surgeon: Purcell Nails, MD;  Location: [facility] OR;  Service: Open Heart Surgery;  Laterality: Right;  . RIGHT/LEFT HEART CATH AND CORONARY ANGIOGRAPHY N/A 09/05/2018   Procedure: RIGHT/LEFT HEART CATH AND CORONARY ANGIOGRAPHY;  Surgeon: Elder Negus, MD;  Location: [facility] INVASIVE CV LAB;  Service: Cardiovascular;  Laterality: N/A;  . TEE WITHOUT CARDIOVERSION N/A 09/05/2018   Procedure: TRANSESOPHAGEAL ECHOCARDIOGRAM (TEE);  Surgeon: [name], MD;  Location: [facility] ENDOSCOPY;  Service: Cardiovascular;  Laterality: N/A;  cath to follow  . TEE WITHOUT CARDIOVERSION N/A 12/26/2018   Procedure: TRANSESOPHAGEAL ECHOCARDIOGRAM (TEE);  Surgeon: Purcell Nails, MD;  Location: [facility] OR;  Service: Open Heart Surgery;  Laterality: N/A;  . TONSILLECTOMY      Social History   Socioeconomic History  . Marital status: Single    Spouse name: Not on file  . Number of children: 0  . Years of education: 57  . Highest education level: Not on file  Occupational History  . Not on file  Social Needs  . Financial resource strain: Not hard at all  . Food insecurity    Worry: Never true    Inability: Never true  . Transportation needs    Medical: No    Non-medical: No  Tobacco Use  . Smoking status: Never Smoker  . Smokeless tobacco: Never Used  Substance and Sexual Activity  . Alcohol use: No  . Drug use: Never  . Sexual activity: Not on file  Lifestyle  . Physical activity    Days per week: 5 days    Minutes per session: 40 min  . Stress: Not at all  Relationships  . Social Musician on phone: Not on file    Gets together: Not on file    Attends religious service: Not on file    Active member of club or organization: Not on file    Attends meetings of clubs or organizations: Not on file    Relationship status: Not on file  . Intimate partner violence    Fear of current or ex partner: Not on file    Emotionally abused: Not on file    Physically abused: Not  on file    Forced sexual activity: Not on file  Other Topics Concern  . Not on file  Social History Narrative  . Not on file   Review of Systems  Constitution: Negative for chills, decreased appetite, malaise/fatigue and weight gain.  Cardiovascular: Negative for dyspnea on exertion, leg swelling and syncope.  Endocrine: Negative for cold intolerance.  Hematologic/Lymphatic: Does not bruise/bleed easily.  Musculoskeletal: Negative for joint swelling.  Gastrointestinal: Negative for abdominal pain, anorexia, change in bowel habit, hematochezia and melena.  Neurological: Negative for headaches and light-headedness.  Psychiatric/Behavioral: Negative for  depression and substance abuse.  All other systems reviewed and are negative.  Objective     Vitals with BMI 04/12/2019 03/26/2019 03/09/2019  Height 6\' 0"  6\' 0"  6\' 0"   Weight 185 lbs 180 lbs 185 lbs 14 oz  BMI 25.08 78.24 23.5  Systolic 361 443 -  Diastolic 74 72 -  Pulse 59 56 -    Physical Exam  Constitutional: He appears well-developed and well-nourished. No distress.  HENT:  Head: Atraumatic.  Eyes: Conjunctivae are normal.  Neck: Neck supple. No JVD present. No thyromegaly present.  Cardiovascular: Normal rate, regular rhythm, normal heart sounds and intact distal pulses. Exam reveals no gallop.  No murmur heard. Pulmonary/Chest: Effort normal and breath sounds normal.  Right lateral  minithoracotomy scar noted.  Abdominal: Soft. Bowel sounds are normal.  Musculoskeletal: Normal range of motion.  Neurological: He is alert.  Skin: Skin is warm and dry.  Psychiatric: He has a normal mood and affect.   Radiology: No results found.  Laboratory examination:   CMP Latest Ref Rng & Units 12/30/2018 12/29/2018 12/28/2018  Glucose 70 - 99 mg/dL 114(H) 116(H) 110(H)  BUN 6 - 20 mg/dL 20 20 15   Creatinine 0.61 - 1.24 mg/dL 0.96 0.98 1.05  Sodium 135 - 145 mmol/L 138 138 138  Potassium 3.5 - 5.1 mmol/L 4.3 3.9 4.0  Chloride 98 - 111 mmol/L 100 100 103  CO2 22 - 32 mmol/L 28 28 30   Calcium 8.9 - 10.3 mg/dL 8.7(L) 8.5(L) 8.2(L)  Total Protein 6.5 - 8.1 g/dL - - -  Total Bilirubin 0.3 - 1.2 mg/dL - - -  Alkaline Phos 38 - 126 U/L - - -  AST 15 - 41 U/L - - -  ALT 0 - 44 U/L - - -   CBC Latest Ref Rng & Units 12/30/2018 12/29/2018 12/28/2018  WBC 4.0 - 10.5 K/uL 8.5 11.9(H) 10.3  Hemoglobin 13.0 - 17.0 g/dL 12.9(L) 12.9(L) 12.1(L)  Hematocrit 39.0 - 52.0 % 38.2(L) 37.9(L) 34.6(L)  Platelets 150 - 400 K/uL 210 206 161   Lipid Panel  No results found for: CHOL, TRIG, HDL, CHOLHDL, VLDL, LDLCALC, LDLDIRECT HEMOGLOBIN A1C Lab Results  Component Value Date   HGBA1C 5.2 12/22/2018    MPG 102.54 12/22/2018   TSH No results for input(s): TSH in the last 8760 hours.   Medications   Current Outpatient Medications  Medication Instructions  . aspirin 81 mg, Oral, Daily  . fluorouracil (EFUDEX) 5 % cream 1 Dose, Topical, 2 times daily  . latanoprost (XALATAN) 0.005 % ophthalmic solution 1 drop, Daily at bedtime  . metoprolol tartrate (LOPRESSOR) 12.5 mg, Oral, 2 times daily  . timolol (TIMOPTIC) 0.5 % ophthalmic solution 1 drop, Left Eye, Daily    Cardiac Studies:   Right and left heart catheterization 09/05/2018: Normal coronary arteries with superdominant right coronary artery No coronary artery disease Normal filling pressures. Recommendation: Follow up with  CVTS re: management of severe mitral regrgitation.  Carotid artery duplex 12/22/2018: Normal carotid artery study.  Echocardiogram 02/02/2019: Normal LV systolic function with EF 55%. Left ventricle cavity is normal in size. Mild concentric hypertrophy of the left ventricle. Normal global wall motion. S/p mitral annuloplasty with well seated ring. No residual mitral regurgitation seen.  No evidence of pulmonary hypertension. Compared to previous study pm 08/16/2018, mitral regurgitation is now resolved s/p mitral annuloplasty.   Assessment     ICD-10-CM   1. Paroxysmal atrial fibrillation (HCC)  I48.0 EKG 12-Lead  2. Mitral valve prolapse  I34.1   3. S/P mitral valve repair  [id number]    Atrial fibrillation is now resolved.  EKG 04/12/2019: Sinus bradycardia at rate of 56 bpm, normal axis.  No evidence of ischemia, normal EKG.No significant change from  EKG 02/28/2019    Recommendations:   Patient is S/P minimally invasive mitral repair on 12/26/2018 and postop A. Fib. Is presently doing well and is maintaining sinus rhythm.   He is now off of amiodarone and also Coumadin, I brought him in and checked his EKG, maintained sinus rhythm.  I will discontinue metoprolol as well.  He'll continue with aspirin  alone.  Endocarditis prophylaxis is indicated.  I'll see him back in one year or sooner if problems. 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   Yates DecampJay Naythen Heikkila, MD, [facility] 04/12/2019, 1:44 PM [facility] Pager: [phone] Office: [phone] If no answer Cell [phone]

## 2019-04-19 ENCOUNTER — Encounter (HOSPITAL_COMMUNITY)
Admission: RE | Admit: 2019-04-19 | Discharge: 2019-04-19 | Disposition: A | Discharge status: Home / Self Care | Payer: Federal, State, Local not specified - PPO | Source: Ambulatory Visit | Attending: Cardiology | Admitting: Cardiology

## 2019-04-19 DIAGNOSIS — Z9889 Other specified postprocedural states: Secondary | ICD-10-CM | POA: Insufficient documentation

## 2019-04-20 DIAGNOSIS — I4891 Unspecified atrial fibrillation: Secondary | ICD-10-CM | POA: Diagnosis not present

## 2019-04-20 DIAGNOSIS — Z0000 Encounter for general adult medical examination without abnormal findings: Secondary | ICD-10-CM | POA: Diagnosis not present

## 2019-04-24 ENCOUNTER — Other Ambulatory Visit: Payer: Self-pay | Admitting: Cardiology

## 2019-04-24 DIAGNOSIS — I480 Paroxysmal atrial fibrillation: Secondary | ICD-10-CM

## 2019-04-27 ENCOUNTER — Other Ambulatory Visit: Payer: Self-pay

## 2019-04-30 DIAGNOSIS — Z0000 Encounter for general adult medical examination without abnormal findings: Secondary | ICD-10-CM | POA: Diagnosis not present

## 2019-04-30 DIAGNOSIS — Z23 Encounter for immunization: Secondary | ICD-10-CM | POA: Diagnosis not present

## 2019-04-30 DIAGNOSIS — R079 Chest pain, unspecified: Secondary | ICD-10-CM | POA: Diagnosis not present

## 2019-05-28 DIAGNOSIS — H401222 Low-tension glaucoma, left eye, moderate stage: Secondary | ICD-10-CM | POA: Diagnosis not present

## 2019-07-27 IMAGING — CR CHEST - 2 VIEW
2 series · 2 of 2 positions shown · non-contrast
Comparison: None.

CLINICAL DATA: Mitral valve insufficiency.  Pre-op respiratory exam

EXAM:
CHEST - 2 VIEW

[w chest pa]
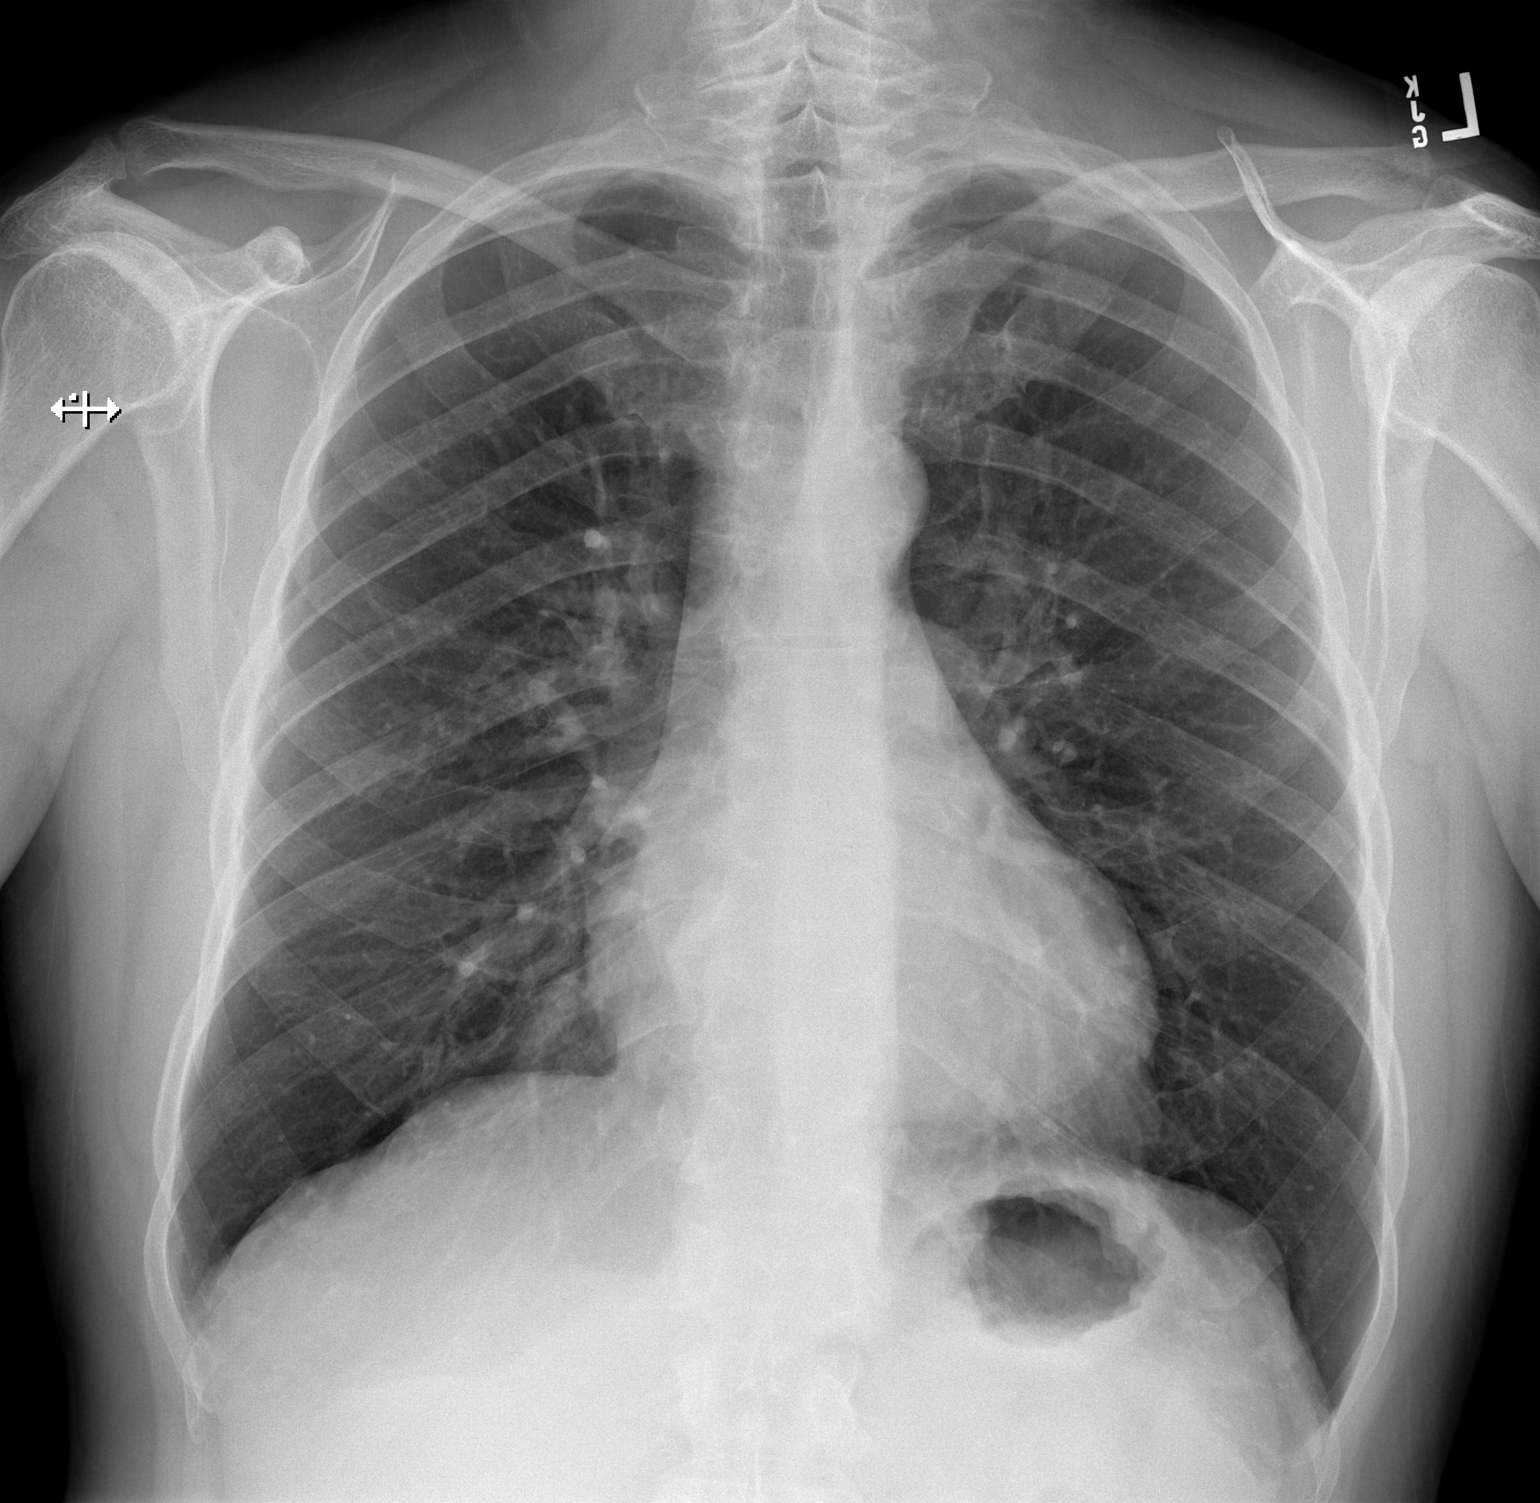

[w chest lat]
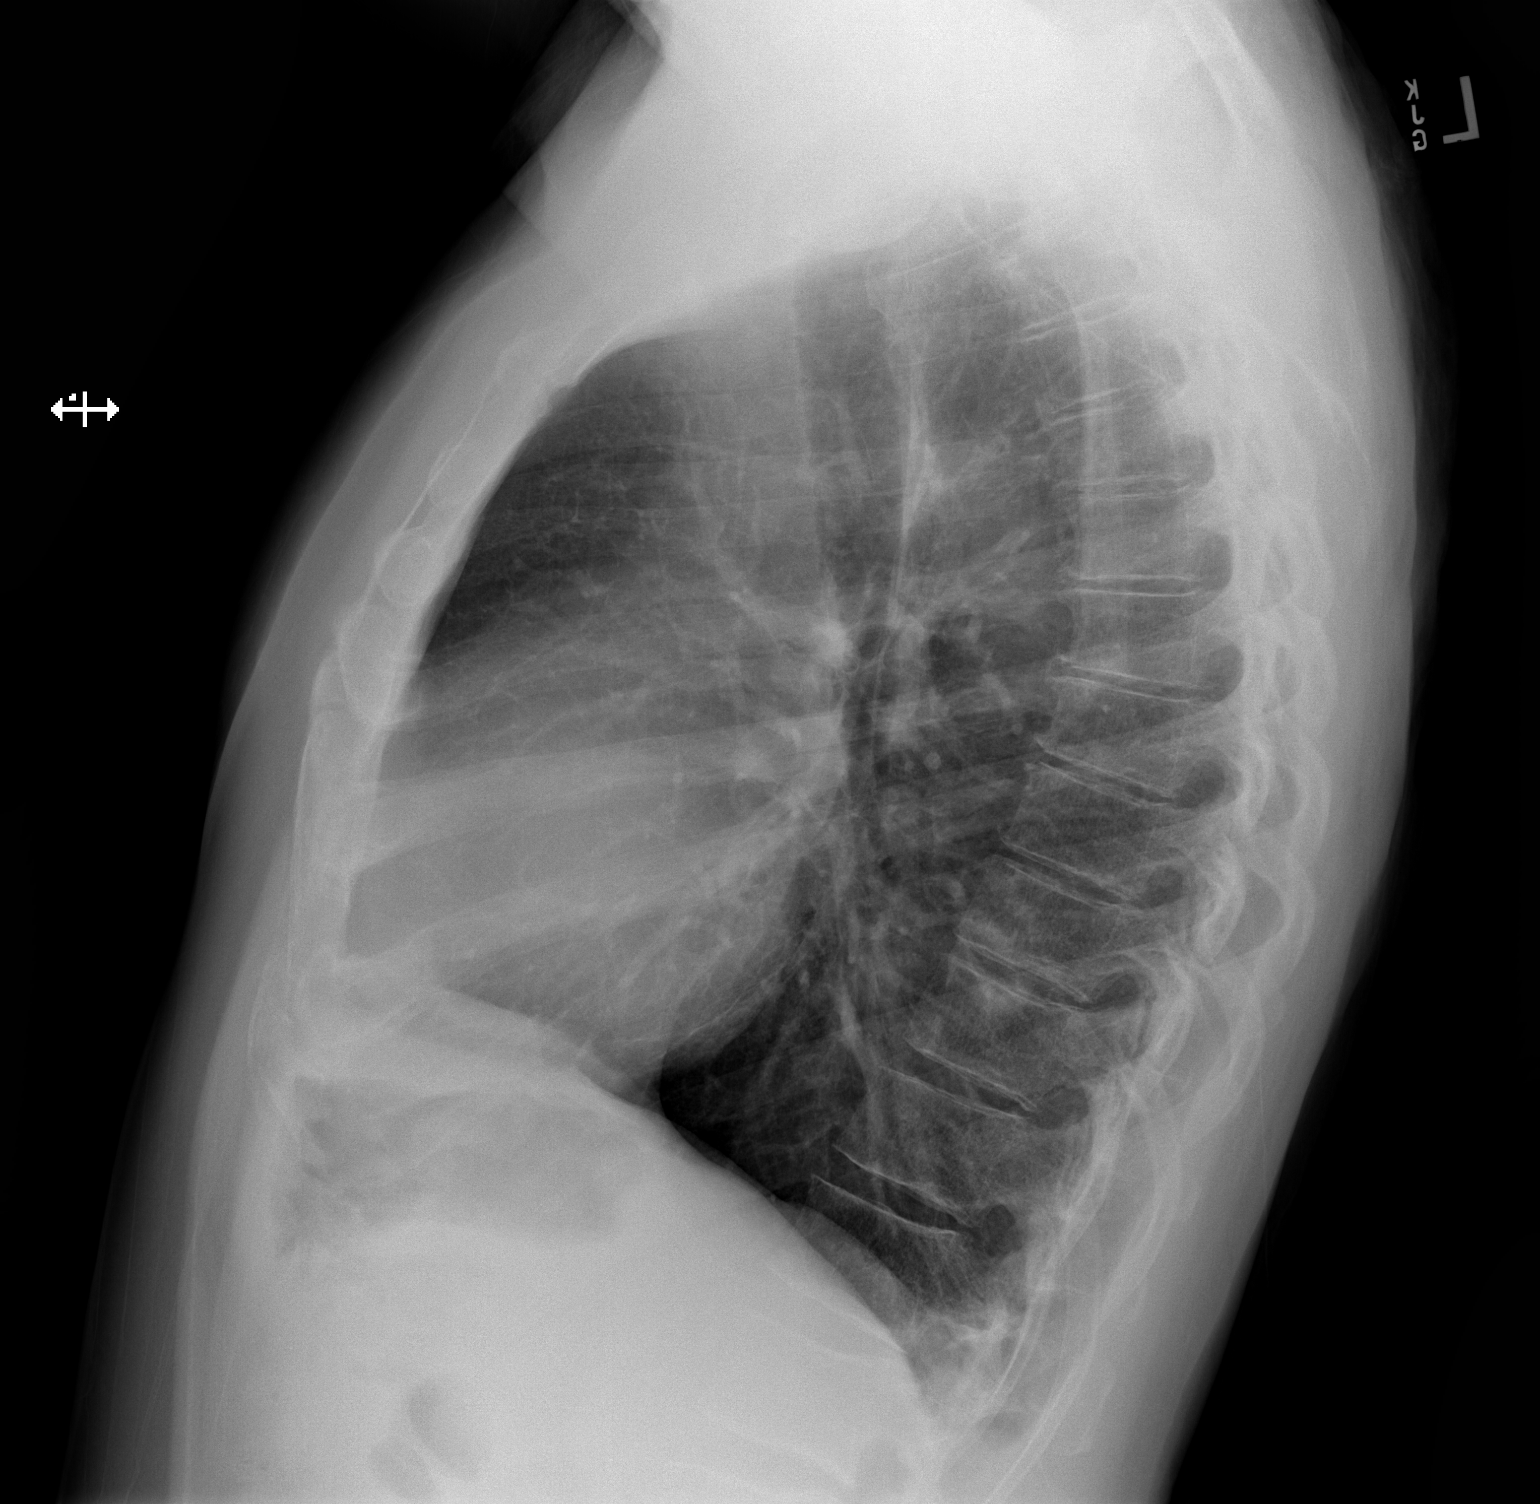

[2 of 2 positions shown; findings below may reference images not displayed]

FINDINGS: The heart size and mediastinal contours are within normal limits.
Both lungs are clear. The visualized skeletal structures are
unremarkable.
IMPRESSION: No active cardiopulmonary disease.

## 2019-08-04 IMAGING — DX CHEST - 2 VIEW
2 series · 2 of 2 positions shown · non-contrast
Comparison: 12/28/2018

CLINICAL DATA: Status post mitral valve repair.

EXAM:
CHEST - 2 VIEW

[chest pa]
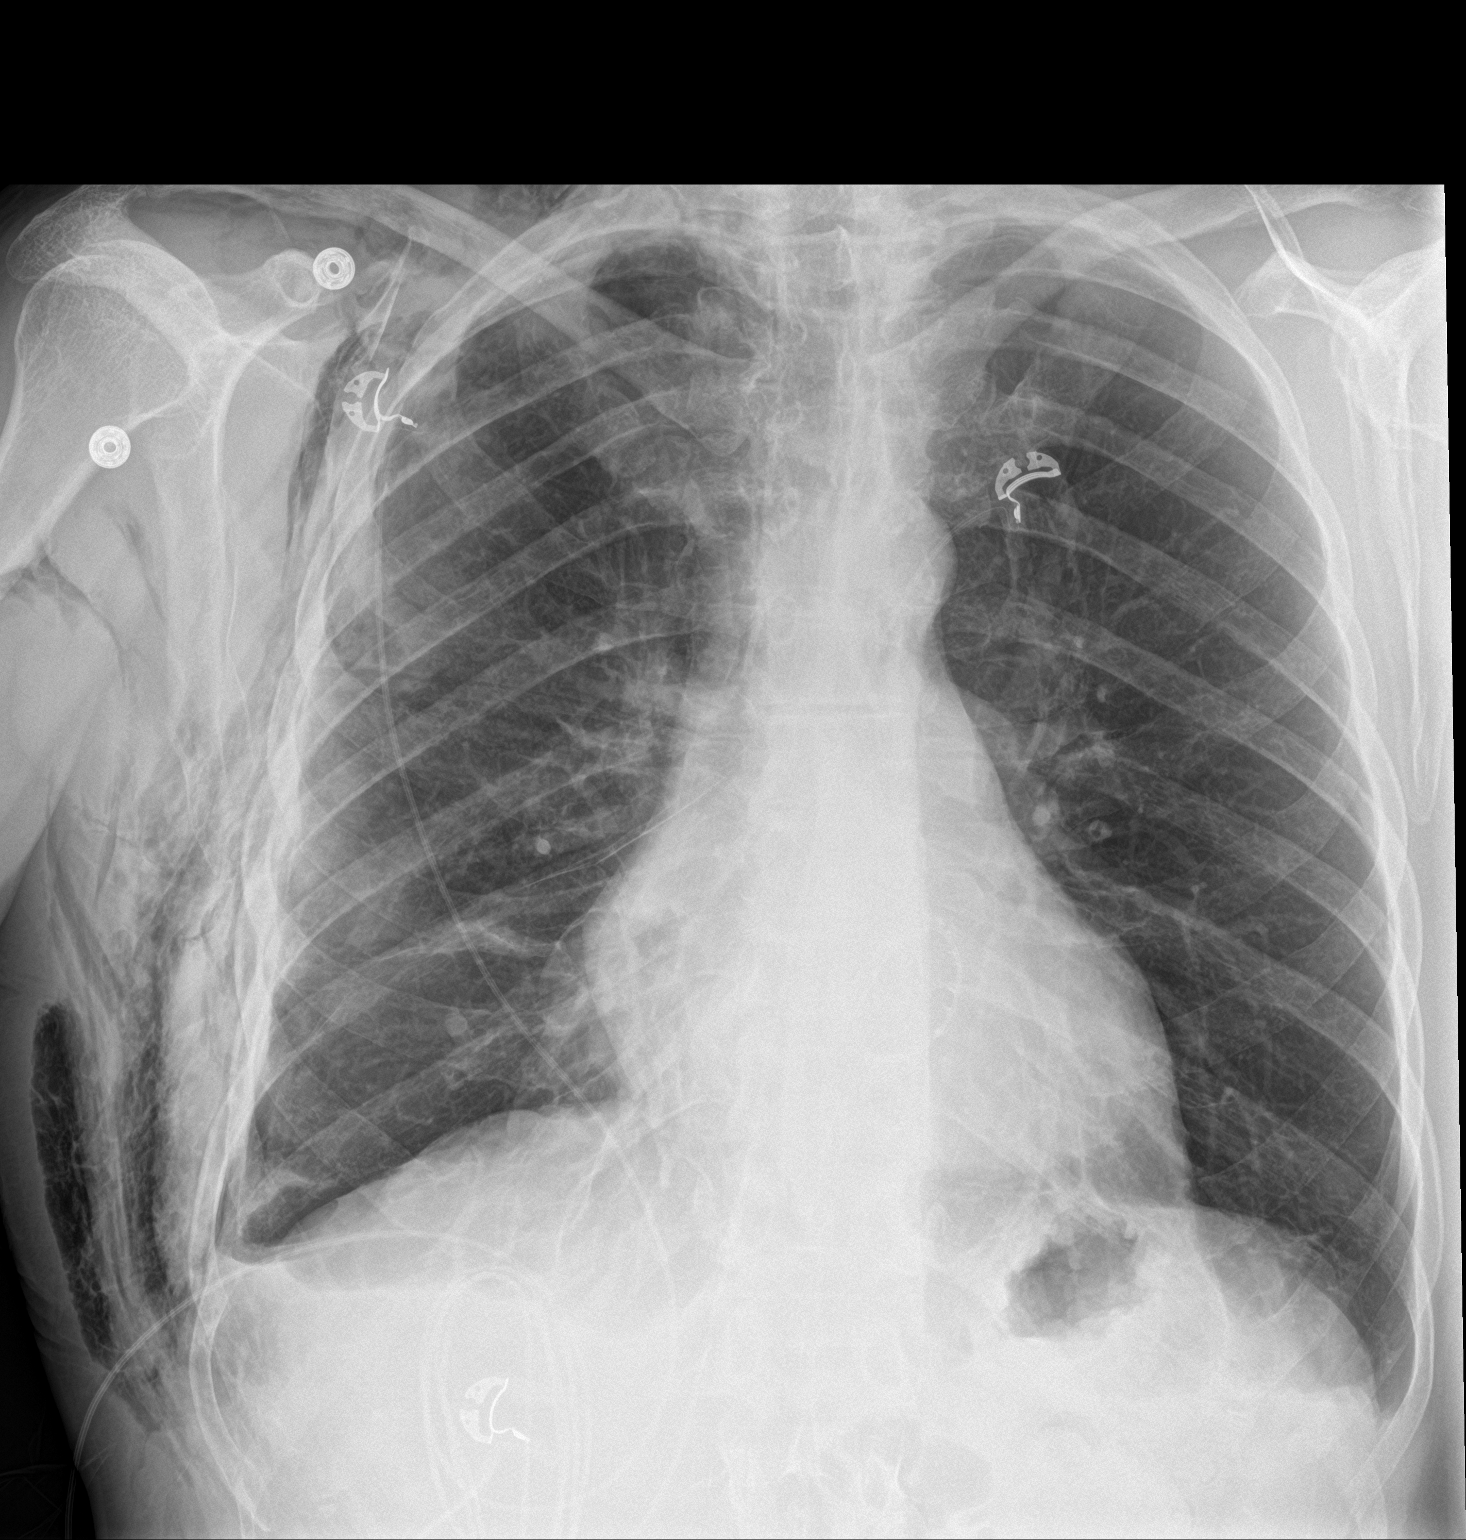

[chest lat]
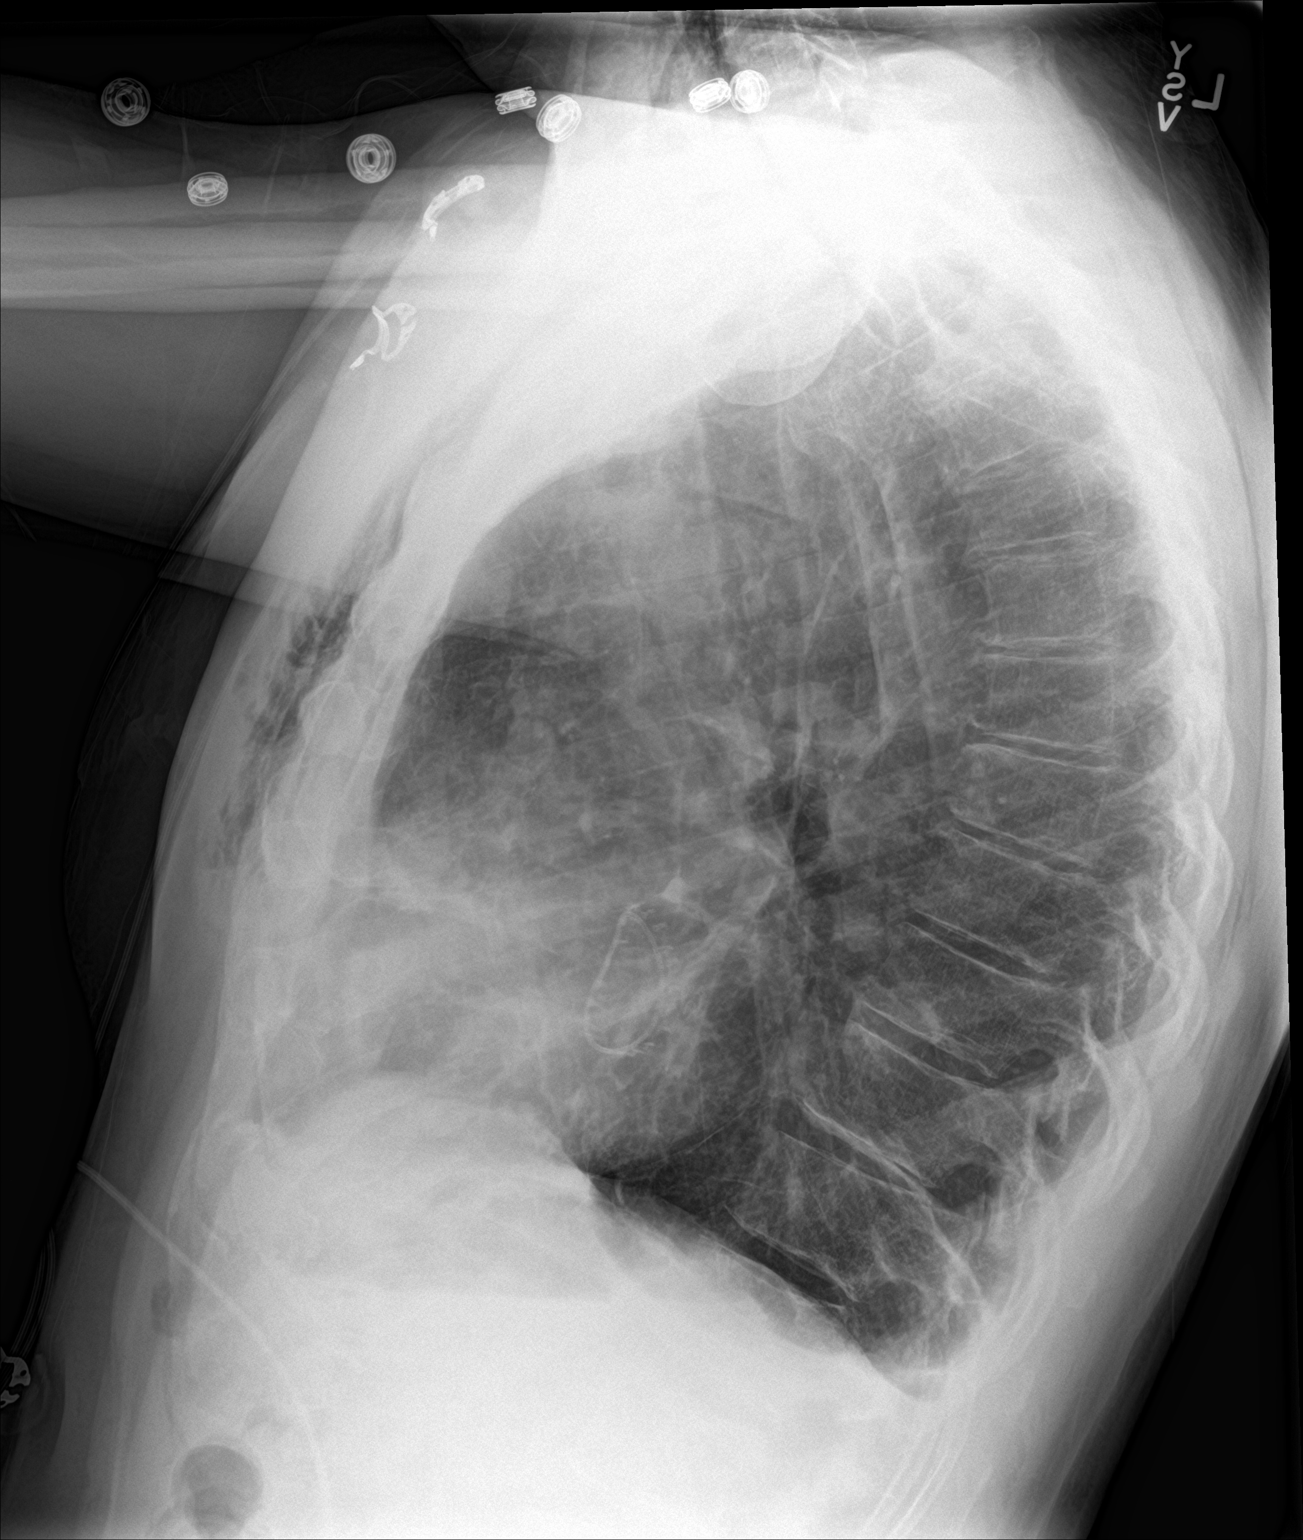

[2 of 2 positions shown; findings below may reference images not displayed]

FINDINGS: RIGHT thoracostomy and mediastinal tubes have been removed.

A small (less than 5%) RIGHT apical pneumothorax and moderate to
large amount of RIGHT subcutaneous emphysema is now noted.

There is no evidence of LEFT lung abnormality.

Cardiomediastinal silhouette is unchanged with mitral valve
replacement.
IMPRESSION: RIGHT thoracostomy and mediastinal tube removed. Small RIGHT apical
pneumothorax and moderate to large amount of RIGHT subcutaneous
emphysema.

These results will be called to the ordering clinician or
representative by the Radiologist Assistant, and communication
documented in the PACS or zVision Dashboard.

## 2019-08-27 DIAGNOSIS — I4891 Unspecified atrial fibrillation: Secondary | ICD-10-CM | POA: Diagnosis not present

## 2019-08-27 DIAGNOSIS — Z125 Encounter for screening for malignant neoplasm of prostate: Secondary | ICD-10-CM | POA: Diagnosis not present

## 2019-08-27 DIAGNOSIS — Z0000 Encounter for general adult medical examination without abnormal findings: Secondary | ICD-10-CM | POA: Diagnosis not present

## 2019-09-03 DIAGNOSIS — Z0000 Encounter for general adult medical examination without abnormal findings: Secondary | ICD-10-CM | POA: Diagnosis not present

## 2019-10-01 DIAGNOSIS — H2513 Age-related nuclear cataract, bilateral: Secondary | ICD-10-CM | POA: Diagnosis not present

## 2019-10-01 DIAGNOSIS — H52203 Unspecified astigmatism, bilateral: Secondary | ICD-10-CM | POA: Diagnosis not present

## 2019-10-01 DIAGNOSIS — H401211 Low-tension glaucoma, right eye, mild stage: Secondary | ICD-10-CM | POA: Diagnosis not present

## 2019-10-01 DIAGNOSIS — H401222 Low-tension glaucoma, left eye, moderate stage: Secondary | ICD-10-CM | POA: Diagnosis not present

## 2019-10-29 IMAGING — CR DG CHEST 2V
2 series · 2 of 2 positions shown · non-contrast
Comparison: Two-view chest x-ray 01/22/2019

CLINICAL DATA: Minimally invasive mitral valve repair 3 months ago.

EXAM:
CHEST - 2 VIEW

[w chest pa]
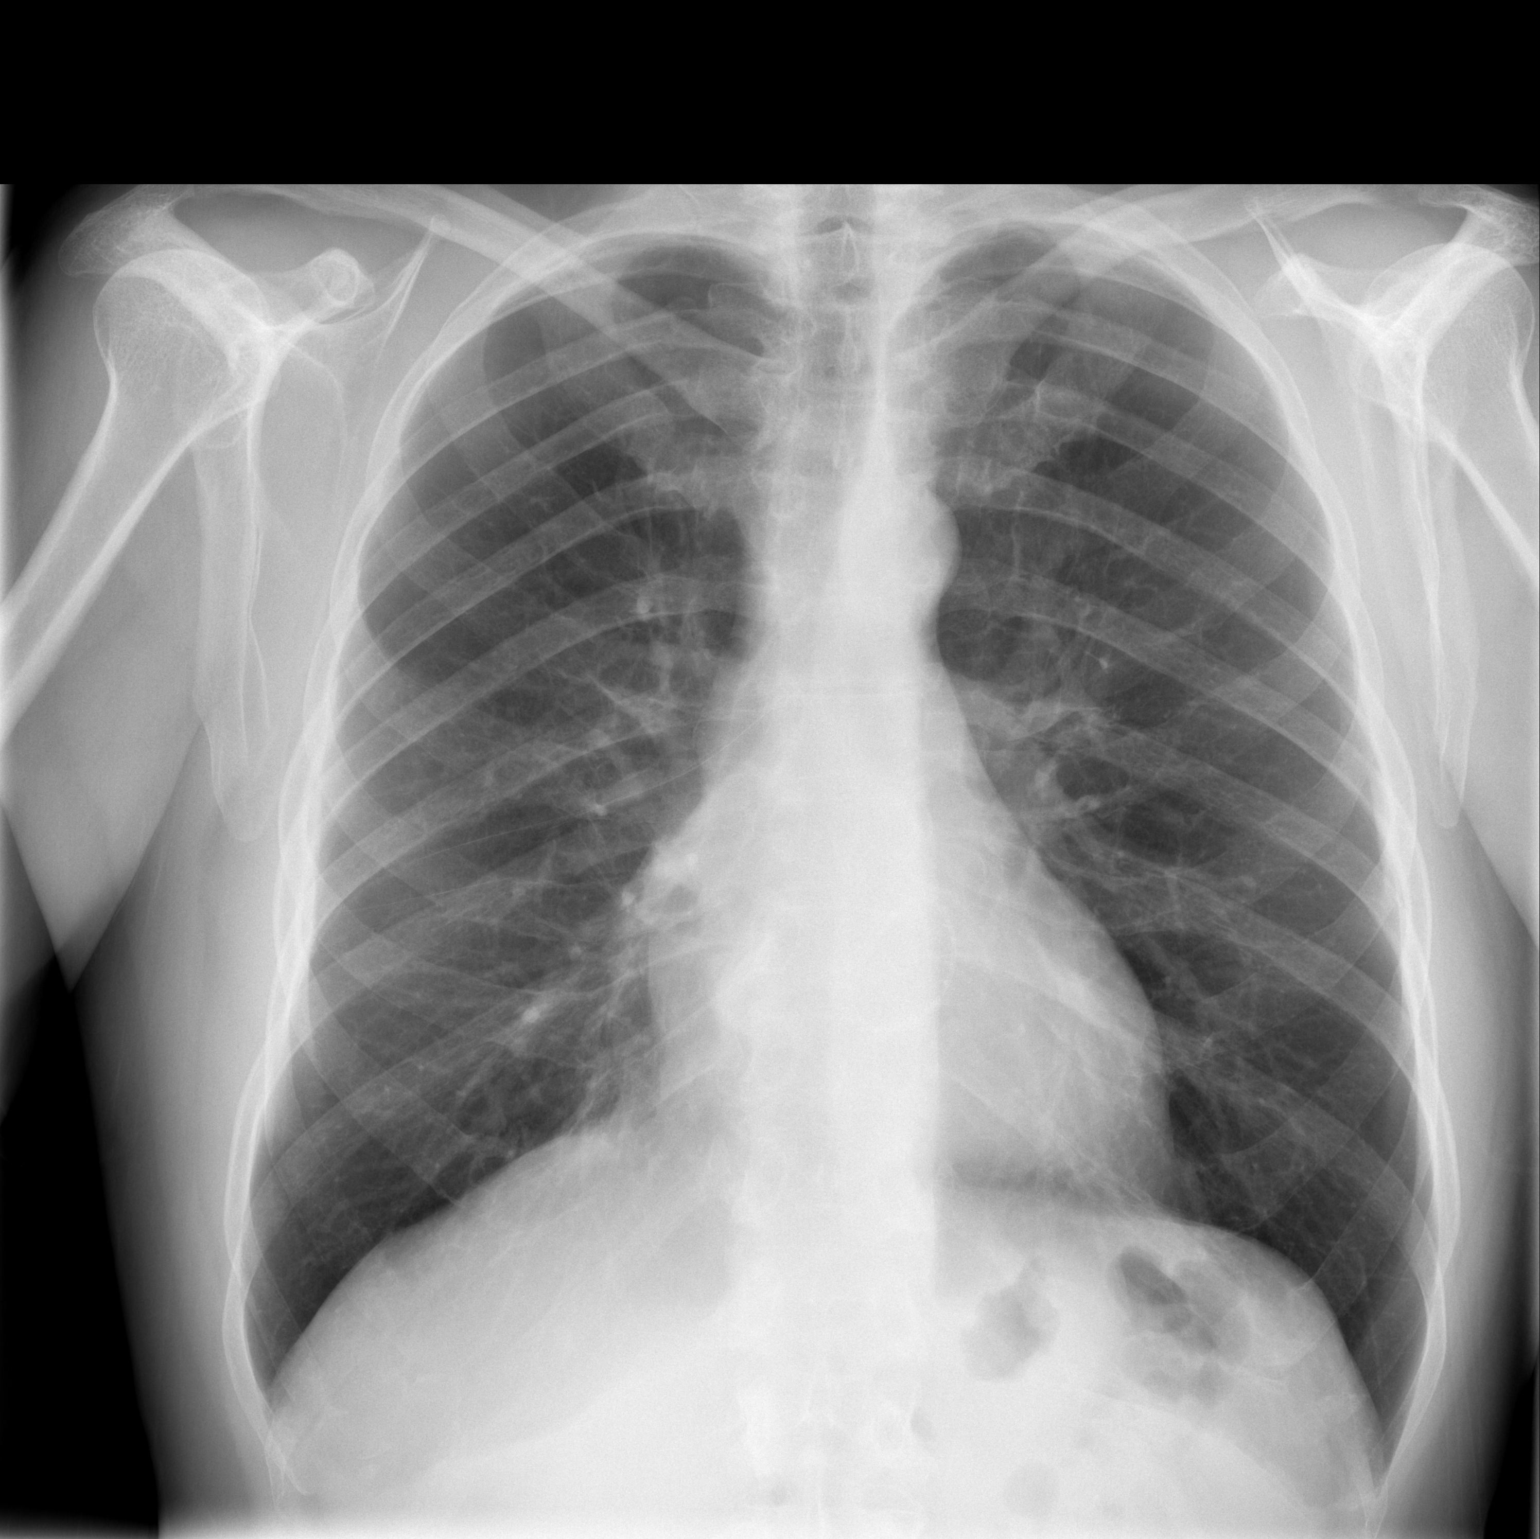

[w chest lat]
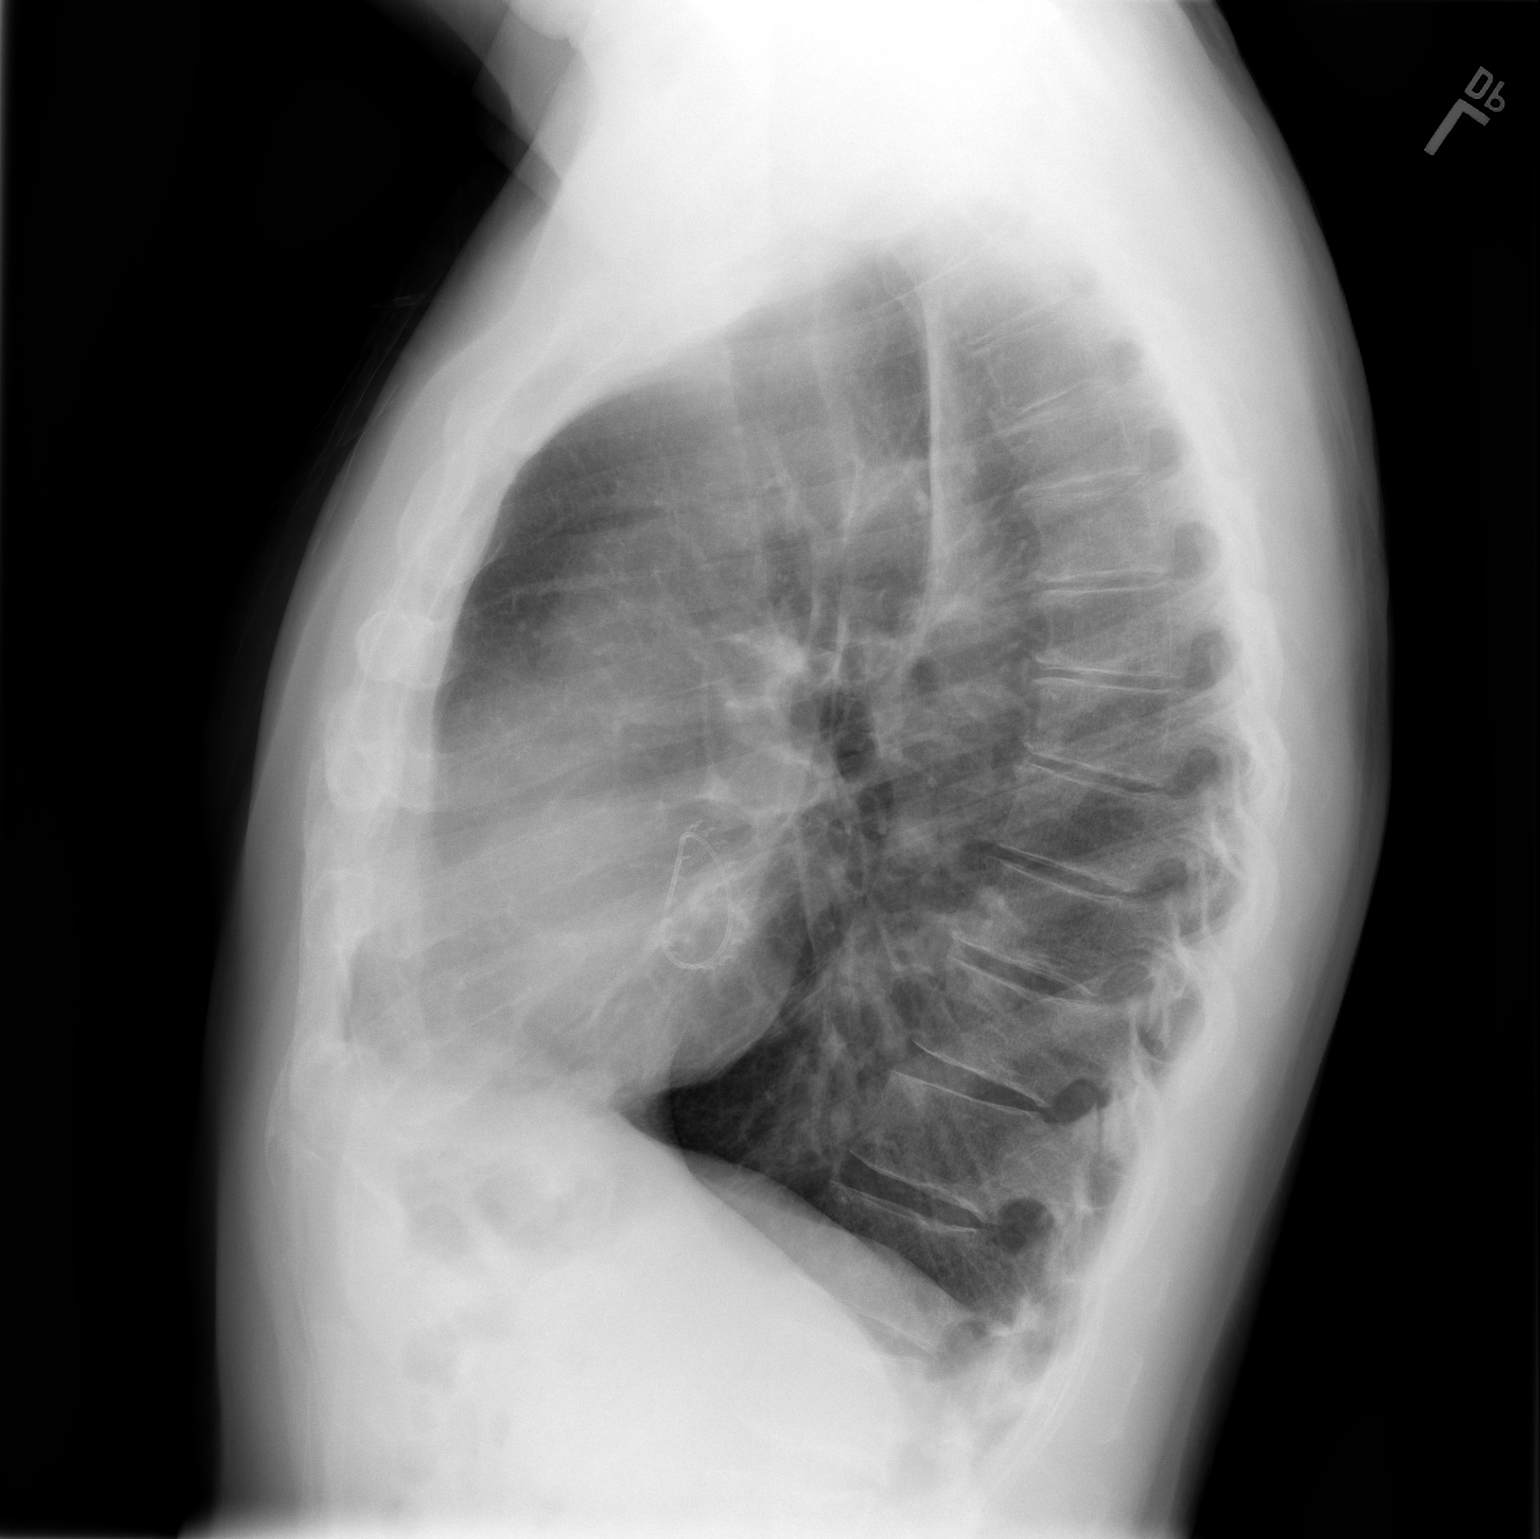

[2 of 2 positions shown; findings below may reference images not displayed]

FINDINGS: The heart size is normal. Mitral valve annular repair is again
noted. There is no edema or effusion. No focal airspace disease is
present.
IMPRESSION: 1. Normal two-view chest x-ray following mitral valve repair.

## 2019-12-18 DIAGNOSIS — L570 Actinic keratosis: Secondary | ICD-10-CM | POA: Diagnosis not present

## 2019-12-24 ENCOUNTER — Other Ambulatory Visit: Payer: Self-pay

## 2019-12-24 ENCOUNTER — Ambulatory Visit: Payer: Federal, State, Local not specified - PPO | Admitting: Thoracic Surgery (Cardiothoracic Vascular Surgery)

## 2019-12-24 ENCOUNTER — Encounter: Payer: Self-pay | Admitting: Thoracic Surgery (Cardiothoracic Vascular Surgery)

## 2019-12-24 VITALS — BP 112/75 | HR 86 | Temp 98.4°F | Resp 16 | Ht 72.0 in | Wt 188.0 lb

## 2019-12-24 DIAGNOSIS — Z9889 Other specified postprocedural states: Secondary | ICD-10-CM | POA: Diagnosis not present

## 2019-12-24 NOTE — Patient Instructions (Signed)

## 2019-12-24 NOTE — Progress Notes (Signed)
° °   °  [address]       Derrick Craig [address]             [phone]     CARDIOTHORACIC SURGERY OFFICE NOTE  Referring Provider is [name], MD PCP is Pearson Grippe, MD   HPI:  Patient is a 59 year old male who returns to the office today for routine follow-up status post minimally invasive mitral valve repair on December 26, 2018 for mitral valve prolapse with severe symptomatic primary mitral regurgitation.  The patient's early postoperative recovery was notable only for brief episode of postoperative atrial fibrillation which resolved.  He underwent routine follow-up transthoracic echocardiogram at Dr. Verl Dicker office on February 02, 2019.  Left ventricular function was felt to be normal with ejection fraction estimated 55%.  He was last seen here in our office on Septmember 14, 2020 at which time he was doing well.    He returns to our office today and reports that he has been doing very well.  Overall he states that he feels "much improved" in comparison to how he felt prior to surgery.  He states that he only gets short of breath with very strenuous physical exertion and his exercise tolerance is much better than it was prior to surgery.  He never has any symptoms of chest pain or chest tightness.  He still has some mild numbness around the site of his surgical incision that does not bother him.  Overall he is quite pleased with his progress.  He has not been vaccinated for COVID-19.  Current Outpatient Medications  Medication Sig Dispense Refill   aspirin EC 81 MG EC tablet Take 1 tablet (81 mg total) by mouth daily.     latanoprost (XALATAN) 0.005 % ophthalmic solution Place 1 drop into both eyes at bedtime.     timolol (TIMOPTIC) 0.5 % ophthalmic solution Place 1 drop into the left eye daily.      No current facility-administered medications for this visit.      Physical Exam:   BP 112/75 (BP Location: Left Arm, Patient Position: Sitting, Cuff Size: Normal)    Pulse 86     Temp 98.4 F (36.9 C) (Temporal)    Resp 16    Ht 6' (1.829 m)    Wt 188 lb (85.3 kg)    SpO2 99% Comment: RA   BMI 25.50 kg/m   General:  Well-appearing  Chest:   Clear to auscultation  CV:   Regular rate and rhythm without murmur  Incisions:  Completely healed  Abdomen:  Soft nontender  Extremities:  Warm and well-perfused  Diagnostic Tests:  n/a   Impression:  Patient is doing very well approximately 1 year status post minimally invasive mitral valve repair  Plan:  Patient will continue to follow-up periodically with Dr. Jacinto Halim.  He will return to our office in the future only should specific problems or questions arise.  The patient has been reminded regarding the importance of dental hygiene and the lifelong need for antibiotic prophylaxis for all dental cleanings and other related invasive procedures.   I spent in excess of 15 minutes during the conduct of this office consultation and >50% of this time involved direct face-to-face encounter with the patient for counseling and/or coordination of their care.    Salvatore Decent. Cornelius Moras, MD 12/24/2019 3:16 PM

## 2020-01-05 ENCOUNTER — Ambulatory Visit: Payer: Federal, State, Local not specified - PPO | Attending: Internal Medicine

## 2020-01-05 ENCOUNTER — Other Ambulatory Visit: Payer: Self-pay

## 2020-01-05 DIAGNOSIS — Z23 Encounter for immunization: Secondary | ICD-10-CM

## 2020-01-05 NOTE — Progress Notes (Signed)
   [id number]  Name:  Derrick Craig.    MRN: [id number] DOB: Nov 19, 1960  01/05/2020  Derrick Craig was observed post Covid-19 immunization for 15 minutes without incident. He was provided with Vaccine Information Sheet and instruction to access the V-Safe system.   Derrick Craig was instructed to call 911 with any severe reactions post vaccine: Marland Kitchen Difficulty breathing  . Swelling of face and throat  . A fast heartbeat  . A bad rash all over body  . Dizziness and weakness   Immunizations Administered    Name Date Dose VIS Date Route   Pfizer COVID-19 Vaccine 01/05/2020  9:57 AM 0.3 mL 09/05/2018 Intramuscular   Manufacturer: [employer], [employer]   Lot: [id number]   NDC: [address]-9

## 2020-01-26 ENCOUNTER — Ambulatory Visit: Payer: Federal, State, Local not specified - PPO | Attending: Internal Medicine

## 2020-01-26 ENCOUNTER — Other Ambulatory Visit: Payer: Self-pay

## 2020-01-26 DIAGNOSIS — Z23 Encounter for immunization: Secondary | ICD-10-CM

## 2020-01-26 NOTE — Progress Notes (Signed)
° °  [facility]  Name:  Derrick Craig.    MRN: [id number] DOB: Apr 17, 1961  01/26/2020  Mr. [name] was observed post Covid-19 immunization for 15 minutes without incident. He was provided with Vaccine Information Sheet and instruction to access the V-Safe system.   Mr. [name] was instructed to call 911 with any severe reactions post vaccine:  Difficulty breathing   Swelling of face and throat   A fast heartbeat   A bad rash all over body   Dizziness and weakness   Immunizations Administered    Name Date Dose VIS Date Route   Pfizer COVID-19 Vaccine 01/26/2020 10:00 AM 0.3 mL 09/05/2018 Intramuscular   Manufacturer: [employer], [employer]   Lot: [id number]   NDC: [address]-5

## 2020-02-04 DIAGNOSIS — H401211 Low-tension glaucoma, right eye, mild stage: Secondary | ICD-10-CM | POA: Diagnosis not present

## 2020-02-04 DIAGNOSIS — H401222 Low-tension glaucoma, left eye, moderate stage: Secondary | ICD-10-CM | POA: Diagnosis not present

## 2020-04-01 DIAGNOSIS — N200 Calculus of kidney: Secondary | ICD-10-CM | POA: Diagnosis not present

## 2020-04-11 ENCOUNTER — Encounter: Payer: Self-pay | Admitting: Cardiology

## 2020-04-11 ENCOUNTER — Other Ambulatory Visit: Payer: Self-pay

## 2020-04-11 ENCOUNTER — Ambulatory Visit: Payer: Federal, State, Local not specified - PPO | Admitting: Cardiology

## 2020-04-11 VITALS — BP 120/80 | HR 70 | Resp 16 | Ht 72.0 in | Wt 195.0 lb

## 2020-04-11 DIAGNOSIS — Z298 Encounter for other specified prophylactic measures: Secondary | ICD-10-CM

## 2020-04-11 DIAGNOSIS — I059 Rheumatic mitral valve disease, unspecified: Secondary | ICD-10-CM | POA: Diagnosis not present

## 2020-04-11 DIAGNOSIS — Z9889 Other specified postprocedural states: Secondary | ICD-10-CM | POA: Diagnosis not present

## 2020-04-11 NOTE — Progress Notes (Signed)
Primary Physician/Referring:  Pearson Grippe, MD  Patient ID: Derrick Dame., male    DOB: August 21, 1960, 59 y.o.   MRN: [id number]  Chief Complaint  Patient presents with  . S/P mitral valve repair  . Follow-up    1 year    HPI: Derrick Craig.  is a 59 y.o. male  Caucasian male with history of mitral valve prolapse and moderate to severe MR, underwent minimally invasive mitral valve repair on 12/26/2018 by Dr. Tressie Stalker.  He did have brief postoperative atrial fibrillation with no recurrence upon follow-up. He is presently asymptomatic.   Past Medical History:  Diagnosis Date  . Atrial fibrillation (HCC) 02/28/2019  . Glaucoma   . Heart valve problem   . History of kidney stones   . MVP (mitral valve prolapse)   . S/P minimally-invasive mitral valve repair 12/26/2018   Complex valvuloplasty including artificial Gore-tex neochord placement x10 and 36 mm Sorin Memo 4D ring annuloplasty via right mini thoracotomy approach  . Severe mitral valve regurgitation     Past Surgical History:  Procedure Laterality Date  . CARDIAC CATHETERIZATION    . MITRAL VALVE REPAIR Right 12/26/2018   Procedure: MINIMALLY INVASIVE MITRAL VALVE REPAIR (MVR) using Memo 4D Ring Size 88mm;  Surgeon: Purcell Nails, MD;  Location: [facility] OR;  Service: Open Heart Surgery;  Laterality: Right;  . RIGHT/LEFT HEART CATH AND CORONARY ANGIOGRAPHY N/A 09/05/2018   Procedure: RIGHT/LEFT HEART CATH AND CORONARY ANGIOGRAPHY;  Surgeon: Elder Negus, MD;  Location: [facility] INVASIVE CV LAB;  Service: Cardiovascular;  Laterality: N/A;  . TEE WITHOUT CARDIOVERSION N/A 09/05/2018   Procedure: TRANSESOPHAGEAL ECHOCARDIOGRAM (TEE);  Surgeon: [name], MD;  Location[facility] ENDOSCOPY;  Service: Cardiovascular;  Laterality: N/A;  cath to follow  . TEE WITHOUT CARDIOVERSION N/A 12/26/2018   Procedure: TRANSESOPHAGEAL ECHOCARDIOGRAM (TEE);  Surgeon: Purcell Nails, MD;  Location: [facility] OR;  Service: Open Heart Surgery;   Laterality: N/A;  . TONSILLECTOMY     Social History   Tobacco Use  . Smoking status: Never Smoker  . Smokeless tobacco: Never Used  Substance Use Topics  . Alcohol use: No   Marital Status: Married  Review of Systems  Cardiovascular: Negative for chest pain, dyspnea on exertion and leg swelling.  Gastrointestinal: Negative for melena.   Objective     Vitals with BMI 04/11/2020 12/24/2019 04/12/2019  Height 6\' 0"  6\' 0"  6\' 0"   Weight 195 lbs 188 lbs 185 lbs  BMI 26.44 25.49 25.08  Systolic 120 112  Diastolic 80 75 74  Pulse 70 86 59    Physical Exam Cardiovascular:     Rate and Rhythm: Normal rate and regular rhythm.     Pulses: Intact distal pulses.     Heart sounds: Normal heart sounds. No murmur heard.  No gallop.      Comments: No leg edema, no JVD. Pulmonary:     Effort: Pulmonary effort is normal.     Breath sounds: Normal breath sounds.  Abdominal:     General: Bowel sounds are normal.     Palpations: Abdomen is soft.    Radiology: No results found.  Laboratory examination:   CMP Latest Ref Rng & Units 12/30/2018 12/29/2018 12/28/2018  Glucose 70 - 99 mg/dL 01/01/2019) 12/31/2018) 12/30/2018)  BUN 6 - 20 mg/dL 20 20 15   Creatinine 0.61 - 1.24 mg/dL 557(D 220(U 542(H  Sodium 135 - 145 mmol/L 138 138 138  Potassium 3.5 - 5.1 mmol/L 4.3  3.9 4.0  Chloride 98 - 111 mmol/L 100 100 103  CO2 22 - 32 mmol/L 28 28 30   Calcium 8.9 - 10.3 mg/dL ) 1.9(U) 1.2(Q)  Total Protein 6.5 - 8.1 g/dL - - -  Total Bilirubin 0.3 - 1.2 mg/dL - - -  Alkaline Phos 38 - 126 U/L - - -  AST 15 - 41 U/L - - -  ALT 0 - 44 U/L - - -   CBC Latest Ref Rng & Units 12/30/2018 12/29/2018 12/28/2018  WBC 4.0 - 10.5 K/uL 8.5 11.9(H) 10.3  Hemoglobin 13.0 - 17.0 g/dL 12.9(L) 12.9(L) 12.1(L)  Hematocrit 39 - 52 % 38.2(L) 37.9(L) 34.6(L)  Platelets 150 - 400 K/uL 210 206 161   Lipid Panel  No results found for: CHOL, TRIG, HDL, CHOLHDL, VLDL, LDLCALC, LDLDIRECT HEMOGLOBIN A1C Lab Results  Component  Value Date   HGBA1C 5.2 12/22/2018   MPG 102.54 12/22/2018   TSH No results for input(s): TSH in the last 8760 hours.   Medications   Current Outpatient Medications  Medication Instructions  . aspirin 81 mg, Oral, Daily  . latanoprost (XALATAN) 0.005 % ophthalmic solution 1 drop, Daily at bedtime  . timolol (TIMOPTIC) 0.5 % ophthalmic solution 1 drop, Left Eye, Daily    Cardiac Studies:   Right and left heart catheterization 09/05/2018: Normal coronary arteries with superdominant right coronary artery No coronary artery disease Normal filling pressures. Recommendation: Follow up with CVTS re: management of severe mitral regrgitation.  Carotid artery duplex 12/22/2018: Normal carotid artery study.  Echocardiogram 02/02/2019: Normal LV systolic function with EF 55%. Left ventricle cavity is normal in size. Mild concentric hypertrophy of the left ventricle. Normal global wall motion. S/p mitral annuloplasty with well seated ring. No residual mitral regurgitation seen.  No evidence of pulmonary hypertension. Compared to previous study pm 08/16/2018, mitral regurgitation is now resolved s/p mitral annuloplasty.   EKG:   EKG 04/11/2020: Normal sinus rhythm with rate of 68 bpm, normal axis, cannot exclude inferior infarct old.  No evidence of ischemia.    Assessment     ICD-10-CM   1. Mitral valve disorder  I05.9   2. S/P mitral valve repair  Z98.890 EKG 12-Lead  3. Indication present for endocarditis prophylaxis  Z29.8    Recommendations:   Derrick Craig.  is a  59 y.o. male  Caucasian male with history of mitral valve prolapse and moderate to severe MR, underwent minimally invasive mitral valve repair on 12/26/2018 by Dr. 12/28/2018.  He did have brief postoperative atrial fibrillation with no recurrence upon follow-up. He is presently asymptomatic.  No change in physical exam, no change in EKG, I will see him back in 2 years for follow-up.  Patient is aware that  he needs endocarditis prophylaxis.   Tressie Stalker, MD, [facility] 04/11/2020, 1:18 PM Office: [phone]

## 2020-04-13 ENCOUNTER — Encounter (HOSPITAL_COMMUNITY): Payer: Self-pay | Admitting: Emergency Medicine

## 2020-04-13 ENCOUNTER — Ambulatory Visit (HOSPITAL_COMMUNITY)
Admission: EM | Admit: 2020-04-13 | Discharge: 2020-04-13 | Disposition: A | Discharge status: Home / Self Care | Payer: Federal, State, Local not specified - PPO | Attending: Emergency Medicine | Admitting: Emergency Medicine

## 2020-04-13 ENCOUNTER — Other Ambulatory Visit: Payer: Self-pay

## 2020-04-13 DIAGNOSIS — H60391 Other infective otitis externa, right ear: Secondary | ICD-10-CM

## 2020-04-13 MED ORDER — NEOMYCIN-POLYMYXIN-HC 3.5-10000-1 OT SUSP: 3.0000 [drp] | Freq: Four times a day (QID) | OTIC | 0 refills | Status: AC

## 2020-04-13 MED ORDER — AMOXICILLIN 500 MG PO CAPS: 1000.0000 mg | ORAL_CAPSULE | Freq: Two times a day (BID) | ORAL | 0 refills | Status: AC

## 2020-04-13 NOTE — ED Triage Notes (Signed)
Pt c/o right ear pain beginning last night. Pt has noticed pink discharge come from ear. He has used peroxide on a q-tip in ear since yesterday. Last dose of tylenol last night. No blood thinners, on 81mg  aspirin. Denies any head injury.

## 2020-04-13 NOTE — Discharge Instructions (Signed)
Begin amoxicillin twice daily for the next 10 days to treat ear infection May use Cortisporin eardrops also to further help with ear infection Tylenol for pain Keep ear clean and dry Follow-up if not improving or worsening

## 2020-04-14 NOTE — ED Provider Notes (Signed)
[facility]    CSN: [id number] Arrival date & time: 04/13/20  1145      History   Chief Complaint Chief Complaint  Patient presents with  . Otalgia    HPI Derrick Lazarz. is a 59 y.o. male presenting today for evaluation of right ear pain.  Patient reports that he used hydrogen peroxide and a Q-tip in his ear to remove some wax that was he was having some slight discomfort.  Since he has developed increased pain and some slight drainage from this area.  Denies symptoms on left.  Denies recent URI symptoms.  Denies blood thinners, but does take 81 mg aspirin daily.  Denies trauma.  Expresses concern as he has had mitral valve repair in the past.  HPI  Past Medical History:  Diagnosis Date  . Atrial fibrillation (HCC) 02/28/2019  . Glaucoma   . Heart valve problem   . History of kidney stones   . MVP (mitral valve prolapse)   . S/P minimally-invasive mitral valve repair 12/26/2018   Complex valvuloplasty including artificial Gore-tex neochord placement x10 and 36 mm Sorin Memo 4D ring annuloplasty via right mini thoracotomy approach  . Severe mitral valve regurgitation     Patient Active Problem List   Diagnosis Date Noted  . Atrial fibrillation (HCC) 02/28/2019  . S/P minimally-invasive mitral valve repair 12/26/2018  . Dyspnea on exertion 08/22/2018  . Mitral valve prolapse 08/22/2018  . Indication present for endocarditis prophylaxis 08/22/2018  . Severe mitral valve regurgitation     Past Surgical History:  Procedure Laterality Date  . CARDIAC CATHETERIZATION    . MITRAL VALVE REPAIR Right 12/26/2018   Procedure: MINIMALLY INVASIVE MITRAL VALVE REPAIR (MVR) using Memo 4D Ring Size 52mm;  Surgeon: Purcell Nails, MD;  Location: [facility] OR;  Service: Open Heart Surgery;  Laterality: Right;  . RIGHT/LEFT HEART CATH AND CORONARY ANGIOGRAPHY N/A 09/05/2018   Procedure: RIGHT/LEFT HEART CATH AND CORONARY ANGIOGRAPHY;  Surgeon: Elder Negus, MD;   [facility];  Service: Cardiovascular;  Laterality: N/A;  . TEE WITHOUT CARDIOVERSION N/A 09/05/2018   Procedure: TRANSESOPHAGEAL ECHOCARDIOGRAM (TEE);  Surgeon: [name], MD;  Location: [facility] ENDOSCOPY;  Service: Cardiovascular;  Laterality: N/A;  cath to follow  . TEE WITHOUT CARDIOVERSION N/A 12/26/2018   Procedure: TRANSESOPHAGEAL ECHOCARDIOGRAM (TEE);  Surgeon: Purcell Nails, MD;  Location: [facility] OR;  Service: Open Heart Surgery;  Laterality: N/A;  . TONSILLECTOMY         Home Medications    Prior to Admission medications   Medication Sig Start Date End Date Taking? Authorizing Provider  aspirin EC 81 MG EC tablet Take 1 tablet (81 mg total) by mouth daily. 01/01/19  Yes Gold, Wayne E, PA-C  latanoprost (XALATAN) 0.005 % ophthalmic solution Place 1 drop into both eyes at bedtime.   Yes [provider]  timolol (TIMOPTIC) 0.5 % ophthalmic solution Place 1 drop into the left eye daily.  05/29/18  Yes [provider]  amoxicillin (AMOXIL) 500 MG capsule Take 2 capsules (1,000 mg total) by mouth 2 (two) times daily for 10 days. 04/13/20 04/23/20  Lucendia Leard C, PA-C  neomycin-polymyxin-hydrocortisone (CORTISPORIN) 3.5-10000-1 OTIC suspension Place 3 drops into the right ear 4 (four) times daily for 7 days. 04/13/20 04/20/20  Limmie Schoenberg, Junius Creamer, PA-C    Family History Family History  Problem Relation Age of Onset  . Heart failure Mother     Social History Social History   Tobacco  Use  . Smoking status: Never Smoker  . Smokeless tobacco: Never Used  Vaping Use  . Vaping Use: Never used  Substance Use Topics  . Alcohol use: No  . Drug use: Never     Allergies   Patient has no known allergies.   Review of Systems Review of Systems  Constitutional: Negative for activity change, appetite change, chills, fatigue and fever.  HENT: Positive for ear discharge and ear pain. Negative for congestion, rhinorrhea, sinus pressure, sore throat and  trouble swallowing.   Eyes: Negative for discharge and redness.  Respiratory: Negative for cough, chest tightness and shortness of breath.   Cardiovascular: Negative for chest pain.  Gastrointestinal: Negative for abdominal pain, diarrhea, nausea and vomiting.  Musculoskeletal: Negative for myalgias.  Skin: Negative for rash.  Neurological: Negative for dizziness, light-headedness and headaches.     Physical Exam Triage Vital Signs ED Triage Vitals  Enc Vitals Group     BP 04/13/20 1254 131/88     Pulse Rate 04/13/20 1254 69     Resp 04/13/20 1254 16     Temp 04/13/20 1254 98.1 F (36.7 C)     Temp Source 04/13/20 1254 Oral     SpO2 04/13/20 1254 100 %     Weight --      Height --      Head Circumference --      Peak Flow --      Pain Score 04/13/20 1255 5     Pain Loc --      Pain Edu? --      Excl. in GC? --    No data found.  Updated Vital Signs BP 131/88 (BP Location: Right Arm)   Pulse 69   Temp 98.1 F (36.7 C) (Oral)   Resp 16   SpO2 100%   Visual Acuity Right Eye Distance:   Left Eye Distance:   Bilateral Distance:    Right Eye Near:   Left Eye Near:    Bilateral Near:     Physical Exam Vitals and nursing note reviewed.  Constitutional:      Appearance: He is well-developed.     Comments: No acute distress  HENT:     Head: Normocephalic and atraumatic.     Ears:     Comments: Right canal appears erythematous and edematous with discharge present, TM partially visualized, appears intact and slightly dull and erythematous    Nose: Nose normal.  Eyes:     Conjunctiva/sclera: Conjunctivae normal.  Cardiovascular:     Rate and Rhythm: Normal rate.  Pulmonary:     Effort: Pulmonary effort is normal. No respiratory distress.  Abdominal:     General: There is no distension.  Musculoskeletal:        General: Normal range of motion.     Cervical back: Neck supple.  Skin:    General: Skin is warm and dry.  Neurological:     Mental Status: He is  alert and oriented to person, place, and time.      UC Treatments / Results  Labs (all labs ordered are listed, but only abnormal results are displayed) Labs Reviewed - No data to display  EKG   Radiology No results found.  Procedures Procedures (including critical care time)  Medications Ordered in UC Medications - No data to display  Initial Impression / Assessment and Plan / UC Course  I have reviewed the triage vital signs and the nursing notes.  Pertinent labs & imaging results that  were available during my care of the patient were reviewed by me and considered in my medical decision making (see chart for details).     Treating for otitis externa, possible otitis media as well, opting to place on oral antibiotics of amoxicillin as well as Cortisporin.  TM appears intact.  Tylenol for pain.  Keep ear clean and dry.  Follow-up if symptoms not improving or worsening.  Discussed strict return precautions. Patient verbalized understanding and is agreeable with plan.  Final Clinical Impressions(s) / UC Diagnoses   Final diagnoses:  Other infective acute otitis externa of right ear     Discharge Instructions     Begin amoxicillin twice daily for the next 10 days to treat ear infection May use Cortisporin eardrops also to further help with ear infection Tylenol for pain Keep ear clean and dry Follow-up if not improving or worsening   ED Prescriptions    Medication Sig Dispense Auth. Provider   amoxicillin (AMOXIL) 500 MG capsule Take 2 capsules (1,000 mg total) by mouth 2 (two) times daily for 10 days. 40 capsule Clovis Warwick C, PA-C   neomycin-polymyxin-hydrocortisone (CORTISPORIN) 3.5-10000-1 OTIC suspension Place 3 drops into the right ear 4 (four) times daily for 7 days. 10 mL Britlyn Martine, [place] C, PA-C     PDMP not reviewed this encounter.   Lew Dawes, [place] 04/14/20 1111

## 2020-04-22 DIAGNOSIS — L720 Epidermal cyst: Secondary | ICD-10-CM | POA: Diagnosis not present

## 2020-04-22 DIAGNOSIS — L218 Other seborrheic dermatitis: Secondary | ICD-10-CM | POA: Diagnosis not present

## 2020-04-22 DIAGNOSIS — L570 Actinic keratosis: Secondary | ICD-10-CM | POA: Diagnosis not present

## 2020-04-22 DIAGNOSIS — Z85828 Personal history of other malignant neoplasm of skin: Secondary | ICD-10-CM | POA: Diagnosis not present

## 2020-04-22 DIAGNOSIS — L814 Other melanin hyperpigmentation: Secondary | ICD-10-CM | POA: Diagnosis not present

## 2020-08-04 DIAGNOSIS — H401222 Low-tension glaucoma, left eye, moderate stage: Secondary | ICD-10-CM | POA: Diagnosis not present

## 2020-08-04 DIAGNOSIS — H2513 Age-related nuclear cataract, bilateral: Secondary | ICD-10-CM | POA: Diagnosis not present

## 2020-08-04 DIAGNOSIS — H524 Presbyopia: Secondary | ICD-10-CM | POA: Diagnosis not present

## 2020-08-04 DIAGNOSIS — H43813 Vitreous degeneration, bilateral: Secondary | ICD-10-CM | POA: Diagnosis not present

## 2020-08-28 DIAGNOSIS — Z125 Encounter for screening for malignant neoplasm of prostate: Secondary | ICD-10-CM | POA: Diagnosis not present

## 2020-08-28 DIAGNOSIS — I10 Essential (primary) hypertension: Secondary | ICD-10-CM | POA: Diagnosis not present

## 2020-08-28 DIAGNOSIS — Z0000 Encounter for general adult medical examination without abnormal findings: Secondary | ICD-10-CM | POA: Diagnosis not present

## 2020-09-03 DIAGNOSIS — I10 Essential (primary) hypertension: Secondary | ICD-10-CM | POA: Diagnosis not present

## 2020-09-03 DIAGNOSIS — Z23 Encounter for immunization: Secondary | ICD-10-CM | POA: Diagnosis not present

## 2020-09-03 DIAGNOSIS — Z0000 Encounter for general adult medical examination without abnormal findings: Secondary | ICD-10-CM | POA: Diagnosis not present

## 2020-09-03 DIAGNOSIS — I4891 Unspecified atrial fibrillation: Secondary | ICD-10-CM | POA: Diagnosis not present

## 2021-01-05 DIAGNOSIS — L821 Other seborrheic keratosis: Secondary | ICD-10-CM | POA: Diagnosis not present

## 2021-01-05 DIAGNOSIS — Z85828 Personal history of other malignant neoplasm of skin: Secondary | ICD-10-CM | POA: Diagnosis not present

## 2021-01-21 DIAGNOSIS — K5900 Constipation, unspecified: Secondary | ICD-10-CM | POA: Diagnosis not present

## 2021-01-21 DIAGNOSIS — Z1211 Encounter for screening for malignant neoplasm of colon: Secondary | ICD-10-CM | POA: Diagnosis not present

## 2021-01-21 DIAGNOSIS — K625 Hemorrhage of anus and rectum: Secondary | ICD-10-CM | POA: Diagnosis not present

## 2021-01-21 DIAGNOSIS — Z800 Family history of malignant neoplasm of digestive organs: Secondary | ICD-10-CM | POA: Diagnosis not present

## 2021-02-02 DIAGNOSIS — H401211 Low-tension glaucoma, right eye, mild stage: Secondary | ICD-10-CM | POA: Diagnosis not present

## 2021-02-02 DIAGNOSIS — H401222 Low-tension glaucoma, left eye, moderate stage: Secondary | ICD-10-CM | POA: Diagnosis not present

## 2021-02-19 ENCOUNTER — Ambulatory Visit: Payer: Federal, State, Local not specified - PPO | Admitting: Podiatry

## 2021-02-19 ENCOUNTER — Encounter: Payer: Self-pay | Admitting: Podiatry

## 2021-02-19 ENCOUNTER — Ambulatory Visit (INDEPENDENT_AMBULATORY_CARE_PROVIDER_SITE_OTHER): Payer: Federal, State, Local not specified - PPO

## 2021-02-19 ENCOUNTER — Other Ambulatory Visit: Payer: Self-pay

## 2021-02-19 DIAGNOSIS — L600 Ingrowing nail: Secondary | ICD-10-CM

## 2021-02-19 DIAGNOSIS — M722 Plantar fascial fibromatosis: Secondary | ICD-10-CM

## 2021-02-19 MED ORDER — TRIAMCINOLONE ACETONIDE 10 MG/ML IJ SUSP
10.0000 mg | Freq: Once | INTRAMUSCULAR | Status: AC
  Administered 2021-02-19: 10 mg

## 2021-02-19 NOTE — Patient Instructions (Signed)

## 2021-02-19 NOTE — Progress Notes (Signed)
Subjective:   Patient ID: Derrick Craig., male   DOB: 60 y.o.   MRN: [id number]   HPI Patient presents with significant discomfort plantar aspect right heel at the insertion tendon approximate 1 month duration and a damaged right hallux nail that was fixed approximately 40 years ago.  It is becoming increasingly hard to cut with spicules that are present medial lateral.  Patient does not smoke and does like to be active   Review of Systems  All other systems reviewed and are negative.      Objective:  Physical Exam Vitals and nursing note reviewed.  Constitutional:      Appearance: He is well-developed.  Pulmonary:     Effort: Pulmonary effort is normal.  Musculoskeletal:        General: Normal range of motion.  Skin:    General: Skin is warm.  Neurological:     Mental Status: He is alert.    Neurovascular status intact muscle strength found to be adequate range of motion adequate with patient noted to have exquisite discomfort plantar aspect right heel at the insertional point tendon calcaneus inflammation fluid around the medial band and also is noted to have a damaged right hallux nail bed thick dystrophic moderately painful when pressed.  Patient has good digital perfusion well oriented x3     Assessment:  At due to plantar fasciitis right with inflammation fluid buildup along with damaged right hallux nail     Plan:  H&P both conditions reviewed we will get a focus on the heel today.  I did sterile prep injected the plantar fascial right 3 mg Kenalog 5 mg Xylocaine applied fascial brace gave instructions for physical therapy discussed long-term possible orthotics and also at next visit removal of the hallux nail permanent right.  Patient wants this done schedule back 2 weeks to discuss and all other discussions commenced  X-rays indicate small spur no indications of advanced arthritis stress fracture

## 2021-03-11 ENCOUNTER — Other Ambulatory Visit: Payer: Self-pay

## 2021-03-11 ENCOUNTER — Encounter: Payer: Self-pay | Admitting: Podiatry

## 2021-03-11 ENCOUNTER — Ambulatory Visit: Payer: Federal, State, Local not specified - PPO | Admitting: Podiatry

## 2021-03-11 DIAGNOSIS — M722 Plantar fascial fibromatosis: Secondary | ICD-10-CM

## 2021-03-11 DIAGNOSIS — L600 Ingrowing nail: Secondary | ICD-10-CM | POA: Diagnosis not present

## 2021-03-11 NOTE — Progress Notes (Signed)
Subjective:   Patient ID: Derrick Craig., male   DOB: 60 y.o.   MRN: [id number]   HPI Patient states he still is having pain but does have improvement from what we did previously but does work on cement floors and needs long-term support   ROS      Objective:  Physical Exam  Neurovascular status intact with exquisite discomfort in the right plantar fascial that has improved still tender upon deep palpation but better with moderate flatfoot deformity     Assessment:  Acute fasciitis-like symptoms with inflammation of the plantar fascial is moderately improved     Plan:  H&P spent a great deal of time educating him on deformity and different treatment options available and at this point I have recommended topical medicines oral medications to be continued over-the-counter insoles and casted for functional orthotics for the long-term reviewing what would be required.  Discussed possible future options including surgical intervention future conservative treatments for physical therapy which I reviewed with him today

## 2021-03-26 DIAGNOSIS — N401 Enlarged prostate with lower urinary tract symptoms: Secondary | ICD-10-CM | POA: Diagnosis not present

## 2021-03-26 DIAGNOSIS — N200 Calculus of kidney: Secondary | ICD-10-CM | POA: Diagnosis not present

## 2021-03-26 DIAGNOSIS — R3911 Hesitancy of micturition: Secondary | ICD-10-CM | POA: Diagnosis not present

## 2021-04-08 ENCOUNTER — Telehealth: Payer: Self-pay | Admitting: Podiatry

## 2021-04-08 NOTE — Telephone Encounter (Signed)
Orthotics in.. lvm for pt to call to schedule an appt to pick them up. °

## 2021-04-16 ENCOUNTER — Ambulatory Visit (INDEPENDENT_AMBULATORY_CARE_PROVIDER_SITE_OTHER): Payer: Federal, State, Local not specified - PPO | Admitting: *Deleted

## 2021-04-16 ENCOUNTER — Other Ambulatory Visit: Payer: Self-pay

## 2021-04-16 DIAGNOSIS — M722 Plantar fascial fibromatosis: Secondary | ICD-10-CM

## 2021-04-16 NOTE — Progress Notes (Signed)
Patient presents today to pick up custom molded foot orthotics, diagnosed with plantar fasciitis by Dr. Charlsie Merles.   Orthotics were dispensed and fit was satisfactory. Reviewed instructions for break-in and wear. Written instructions given to patient.  Patient will follow up as needed.   [facility] - order # [id number]

## 2021-04-27 DIAGNOSIS — Z85828 Personal history of other malignant neoplasm of skin: Secondary | ICD-10-CM | POA: Diagnosis not present

## 2021-04-27 DIAGNOSIS — L905 Scar conditions and fibrosis of skin: Secondary | ICD-10-CM | POA: Diagnosis not present

## 2021-04-27 DIAGNOSIS — L821 Other seborrheic keratosis: Secondary | ICD-10-CM | POA: Diagnosis not present

## 2021-04-27 DIAGNOSIS — L570 Actinic keratosis: Secondary | ICD-10-CM | POA: Diagnosis not present

## 2021-04-27 DIAGNOSIS — L723 Sebaceous cyst: Secondary | ICD-10-CM | POA: Diagnosis not present

## 2021-07-20 DIAGNOSIS — H401222 Low-tension glaucoma, left eye, moderate stage: Secondary | ICD-10-CM | POA: Diagnosis not present

## 2021-07-20 DIAGNOSIS — H401211 Low-tension glaucoma, right eye, mild stage: Secondary | ICD-10-CM | POA: Diagnosis not present

## 2021-07-20 DIAGNOSIS — H2513 Age-related nuclear cataract, bilateral: Secondary | ICD-10-CM | POA: Diagnosis not present

## 2021-07-20 DIAGNOSIS — H5213 Myopia, bilateral: Secondary | ICD-10-CM | POA: Diagnosis not present

## 2021-08-27 DIAGNOSIS — N5201 Erectile dysfunction due to arterial insufficiency: Secondary | ICD-10-CM | POA: Diagnosis not present

## 2021-08-27 DIAGNOSIS — N401 Enlarged prostate with lower urinary tract symptoms: Secondary | ICD-10-CM | POA: Diagnosis not present

## 2021-08-27 DIAGNOSIS — R3911 Hesitancy of micturition: Secondary | ICD-10-CM | POA: Diagnosis not present

## 2021-09-07 DIAGNOSIS — Z0000 Encounter for general adult medical examination without abnormal findings: Secondary | ICD-10-CM | POA: Diagnosis not present

## 2021-12-17 ENCOUNTER — Ambulatory Visit: Payer: Federal, State, Local not specified - PPO | Admitting: Cardiology

## 2021-12-17 ENCOUNTER — Encounter: Payer: Self-pay | Admitting: Cardiology

## 2021-12-17 VITALS — BP 95/53 | HR 71 | Temp 98.0°F | Resp 17 | Ht 72.0 in | Wt 193.8 lb

## 2021-12-17 DIAGNOSIS — N5201 Erectile dysfunction due to arterial insufficiency: Secondary | ICD-10-CM

## 2021-12-17 DIAGNOSIS — Z298 Encounter for other specified prophylactic measures: Secondary | ICD-10-CM | POA: Diagnosis not present

## 2021-12-17 DIAGNOSIS — Z9889 Other specified postprocedural states: Secondary | ICD-10-CM | POA: Diagnosis not present

## 2021-12-17 DIAGNOSIS — I059 Rheumatic mitral valve disease, unspecified: Secondary | ICD-10-CM

## 2021-12-17 NOTE — Progress Notes (Signed)
Primary Physician/Referring:  Janie Morning, DO  Patient ID: Derrick Craig., male    DOB: 16-Aug-1960, 61 y.o.   MRN: [id number]  Chief Complaint  Patient presents with   Follow-up    2 YEAR   MV repair   HPI: Derrick Craig.  is a 61 y.o. male  Caucasian male with history of mitral valve prolapse and moderate to severe MR, underwent minimally invasive mitral valve repair on 12/26/2018 by Dr. Darylene Price.  He did have brief postoperative atrial fibrillation with no recurrence upon follow-up. He is presently asymptomatic.   Past Medical History:  Diagnosis Date   Atrial fibrillation ([place]) 02/28/2019   Glaucoma    Heart valve problem    History of kidney stones    MVP (mitral valve prolapse)    S/P minimally-invasive mitral valve repair 12/26/2018   Complex valvuloplasty including artificial Gore-tex neochord placement x10 and 36 mm Sorin Memo 4D ring annuloplasty via right mini thoracotomy approach   Severe mitral valve regurgitation     Past Surgical History:  Procedure Laterality Date   CARDIAC CATHETERIZATION     MITRAL VALVE REPAIR Right 12/26/2018   Procedure: MINIMALLY INVASIVE MITRAL VALVE REPAIR (MVR) using Memo 4D Ring Size 33m;  Surgeon: ORexene Alberts MD;  [facility]  Service: Open Heart Surgery;  Laterality: Right;   RIGHT/LEFT HEART CATH AND CORONARY ANGIOGRAPHY N/A 09/05/2018   Procedure: RIGHT/LEFT HEART CATH AND CORONARY ANGIOGRAPHY;  Surgeon: PNigel Mormon MD;  [facility];  Service: Cardiovascular;  Laterality: N/A;   TEE WITHOUT CARDIOVERSION N/A 09/05/2018   Procedure: TRANSESOPHAGEAL ECHOCARDIOGRAM (TEE);  Surgeon: GAdrian Prows MD;  [facility];  Service: Cardiovascular;  Laterality: N/A;  cath to follow   TEE WITHOUT CARDIOVERSION N/A 12/26/2018   Procedure: TRANSESOPHAGEAL ECHOCARDIOGRAM (TEE);  Surgeon: ORexene Alberts MD;  [facility]  Service: Open Heart Surgery;  Laterality: N/A;    TONSILLECTOMY     Social History   Tobacco Use   Smoking status: Never   Smokeless tobacco: Never  Substance Use Topics   Alcohol use: No   Marital Status: Married  Review of Systems  Cardiovascular:  Negative for chest pain, dyspnea on exertion and leg swelling.  Gastrointestinal:  Negative for melena.   Objective        12/17/2021   11:43 AM 04/13/2020   12:54 PM 04/11/2020   12:42 PM  Vitals with BMI  Height '6\' 0"'   '6\' 0"'   Weight 193 lbs 13 oz  195 lbs  BMI 208.81 210.31 Systolic 95 [id number] Diastolic 53 88 80  Pulse 71 69 70    Physical Exam Cardiovascular:     Rate and Rhythm: Normal rate and regular rhythm.     Pulses: Intact distal pulses.     Heart sounds: Normal heart sounds. No murmur heard.    No gallop.     Comments: No leg edema, no JVD. Pulmonary:     Effort: Pulmonary effort is normal.     Breath sounds: Normal breath sounds.  Abdominal:     General: Bowel sounds are normal.     Palpations: Abdomen is soft.    Radiology: No results found.  Laboratory examination:  External labs:   Labs 08/31/2021:  Hb 16.2/HCT 46.6, platelets 259, normal indicis.  BUN 19, creatinine 0.96, EGFR 87 mL, potassium 4.6, LFTs normal.  A1c 5.3%.  Total cholesterol 142, triglycerides 73, HDL 44, LDL 83.  TSH normal at 2.42. Medications    Current Outpatient Medications:    aspirin EC 81 MG EC tablet, Take 1 tablet (81 mg total) by mouth daily., Disp:  , Rfl:    latanoprost (XALATAN) 0.005 % ophthalmic solution, Place 1 drop into both eyes at bedtime., Disp: , Rfl:    timolol (TIMOPTIC) 0.5 % ophthalmic solution, Place 1 drop into the left eye daily. , Disp: , Rfl:    Cardiac Studies:   Right and left heart catheterization 09/05/2018: Normal coronary arteries with superdominant right coronary artery No coronary artery disease Normal filling pressures. Recommendation: Follow up with CVTS re: management of severe mitral regrgitation.  Carotid artery duplex  12/22/2018: Normal carotid artery study.  Echocardiogram 02/02/2019: Normal LV systolic function with EF 55%. Left ventricle cavity is normal in size. Mild concentric hypertrophy of the left ventricle. Normal global wall motion.  S/p mitral annuloplasty with well seated ring. No residual mitral regurgitation seen.  No evidence of pulmonary hypertension. Compared to previous study pm 08/16/2018, mitral regurgitation is now resolved s/p mitral annuloplasty.   EKG:  EKG 12/17/2021: Normal sinus rhythm at rate of 69 bpm, left atrial enlargement, otherwise normal EKG. no significant change from 04/11/2020.   Assessment     ICD-10-CM   1. Mitral valve disorder  I05.9 EKG 12-Lead    2. S/P mitral valve repair  Z98.890     3. Erectile dysfunction due to arterial insufficiency  N52.01     4. Indication present for endocarditis prophylaxis  Z29.8      Recommendations:   Derrick Craig.  is a  61 y.o. male  Caucasian male with history of mitral valve prolapse and moderate to severe MR, underwent minimally invasive mitral valve repair on 12/26/2018 by Dr. Darylene Price.  He did have brief postoperative atrial fibrillation with no recurrence upon follow-up. He is presently asymptomatic.  No change in physical exam, no change in EKG. his blood pressure is soft however he is completely asymptomatic.  Would not be concerned about this.  He has Dr. Rectal dysfunction, he has been skeptical and afraid of using Cialis/Viagra that was prescribed by his urologist, advised him that from cardiac standpoint I do not see any contraindication.  Reviewed his external labs, renal function is normal, lipids are normal, I will see him back in 2 years for follow-up.  Patient is aware that he needs endocarditis prophylaxis.   Adrian Prows, MD, [facility] 12/17/2021, 12:34 PM Office: [phone]

## 2022-01-18 DIAGNOSIS — H401211 Low-tension glaucoma, right eye, mild stage: Secondary | ICD-10-CM | POA: Diagnosis not present

## 2022-01-18 DIAGNOSIS — H401222 Low-tension glaucoma, left eye, moderate stage: Secondary | ICD-10-CM | POA: Diagnosis not present

## 2022-03-25 DIAGNOSIS — N5201 Erectile dysfunction due to arterial insufficiency: Secondary | ICD-10-CM | POA: Diagnosis not present

## 2022-03-25 DIAGNOSIS — N400 Enlarged prostate without lower urinary tract symptoms: Secondary | ICD-10-CM | POA: Diagnosis not present

## 2022-03-25 DIAGNOSIS — N200 Calculus of kidney: Secondary | ICD-10-CM | POA: Diagnosis not present

## 2022-04-29 DIAGNOSIS — Z85828 Personal history of other malignant neoplasm of skin: Secondary | ICD-10-CM | POA: Diagnosis not present

## 2022-04-29 DIAGNOSIS — B078 Other viral warts: Secondary | ICD-10-CM | POA: Diagnosis not present

## 2022-04-29 DIAGNOSIS — L918 Other hypertrophic disorders of the skin: Secondary | ICD-10-CM | POA: Diagnosis not present

## 2022-04-29 DIAGNOSIS — L821 Other seborrheic keratosis: Secondary | ICD-10-CM | POA: Diagnosis not present

## 2022-04-29 DIAGNOSIS — L570 Actinic keratosis: Secondary | ICD-10-CM | POA: Diagnosis not present

## 2022-07-19 DIAGNOSIS — H25043 Posterior subcapsular polar age-related cataract, bilateral: Secondary | ICD-10-CM | POA: Diagnosis not present

## 2022-07-19 DIAGNOSIS — H524 Presbyopia: Secondary | ICD-10-CM | POA: Diagnosis not present

## 2022-07-19 DIAGNOSIS — H401222 Low-tension glaucoma, left eye, moderate stage: Secondary | ICD-10-CM | POA: Diagnosis not present

## 2022-07-19 DIAGNOSIS — H43813 Vitreous degeneration, bilateral: Secondary | ICD-10-CM | POA: Diagnosis not present

## 2022-07-19 DIAGNOSIS — H2513 Age-related nuclear cataract, bilateral: Secondary | ICD-10-CM | POA: Diagnosis not present

## 2022-07-19 DIAGNOSIS — H5213 Myopia, bilateral: Secondary | ICD-10-CM | POA: Diagnosis not present

## 2022-09-02 DIAGNOSIS — Z0000 Encounter for general adult medical examination without abnormal findings: Secondary | ICD-10-CM | POA: Diagnosis not present

## 2022-09-02 DIAGNOSIS — Z125 Encounter for screening for malignant neoplasm of prostate: Secondary | ICD-10-CM | POA: Diagnosis not present

## 2022-09-02 DIAGNOSIS — R7309 Other abnormal glucose: Secondary | ICD-10-CM | POA: Diagnosis not present

## 2022-09-02 DIAGNOSIS — R946 Abnormal results of thyroid function studies: Secondary | ICD-10-CM | POA: Diagnosis not present

## 2022-09-09 DIAGNOSIS — Z0000 Encounter for general adult medical examination without abnormal findings: Secondary | ICD-10-CM | POA: Diagnosis not present

## 2022-09-09 DIAGNOSIS — H409 Unspecified glaucoma: Secondary | ICD-10-CM | POA: Diagnosis not present

## 2022-09-09 DIAGNOSIS — Z87442 Personal history of urinary calculi: Secondary | ICD-10-CM | POA: Diagnosis not present

## 2022-09-09 DIAGNOSIS — Z9889 Other specified postprocedural states: Secondary | ICD-10-CM | POA: Diagnosis not present

## 2023-01-17 DIAGNOSIS — H401222 Low-tension glaucoma, left eye, moderate stage: Secondary | ICD-10-CM | POA: Diagnosis not present

## 2023-05-03 DIAGNOSIS — L905 Scar conditions and fibrosis of skin: Secondary | ICD-10-CM | POA: Diagnosis not present

## 2023-05-03 DIAGNOSIS — L570 Actinic keratosis: Secondary | ICD-10-CM | POA: Diagnosis not present

## 2023-05-03 DIAGNOSIS — Z85828 Personal history of other malignant neoplasm of skin: Secondary | ICD-10-CM | POA: Diagnosis not present

## 2023-05-03 DIAGNOSIS — L821 Other seborrheic keratosis: Secondary | ICD-10-CM | POA: Diagnosis not present

## 2023-07-27 DIAGNOSIS — H401222 Low-tension glaucoma, left eye, moderate stage: Secondary | ICD-10-CM | POA: Diagnosis not present

## 2023-07-27 DIAGNOSIS — H2513 Age-related nuclear cataract, bilateral: Secondary | ICD-10-CM | POA: Diagnosis not present

## 2023-07-27 DIAGNOSIS — H43813 Vitreous degeneration, bilateral: Secondary | ICD-10-CM | POA: Diagnosis not present

## 2023-07-27 DIAGNOSIS — H25043 Posterior subcapsular polar age-related cataract, bilateral: Secondary | ICD-10-CM | POA: Diagnosis not present

## 2023-09-05 DIAGNOSIS — N5201 Erectile dysfunction due to arterial insufficiency: Secondary | ICD-10-CM | POA: Diagnosis not present

## 2023-09-05 DIAGNOSIS — N401 Enlarged prostate with lower urinary tract symptoms: Secondary | ICD-10-CM | POA: Diagnosis not present

## 2023-09-05 DIAGNOSIS — N200 Calculus of kidney: Secondary | ICD-10-CM | POA: Diagnosis not present

## 2023-09-05 DIAGNOSIS — R350 Frequency of micturition: Secondary | ICD-10-CM | POA: Diagnosis not present

## 2023-09-09 DIAGNOSIS — R351 Nocturia: Secondary | ICD-10-CM | POA: Diagnosis not present

## 2023-09-09 DIAGNOSIS — R946 Abnormal results of thyroid function studies: Secondary | ICD-10-CM | POA: Diagnosis not present

## 2023-09-09 DIAGNOSIS — R7309 Other abnormal glucose: Secondary | ICD-10-CM | POA: Diagnosis not present

## 2023-09-09 DIAGNOSIS — Z125 Encounter for screening for malignant neoplasm of prostate: Secondary | ICD-10-CM | POA: Diagnosis not present

## 2023-09-15 DIAGNOSIS — Z23 Encounter for immunization: Secondary | ICD-10-CM | POA: Diagnosis not present

## 2023-09-15 DIAGNOSIS — Z0000 Encounter for general adult medical examination without abnormal findings: Secondary | ICD-10-CM | POA: Diagnosis not present

## 2023-09-15 DIAGNOSIS — H6120 Impacted cerumen, unspecified ear: Secondary | ICD-10-CM | POA: Diagnosis not present

## 2023-09-27 DIAGNOSIS — K625 Hemorrhage of anus and rectum: Secondary | ICD-10-CM | POA: Diagnosis not present

## 2023-09-27 DIAGNOSIS — Z1211 Encounter for screening for malignant neoplasm of colon: Secondary | ICD-10-CM | POA: Diagnosis not present

## 2023-11-03 ENCOUNTER — Telehealth: Payer: Self-pay | Admitting: *Deleted

## 2023-11-03 NOTE — Telephone Encounter (Signed)
    Primary Cardiologist:None  Chart reviewed as part of pre-operative protocol coverage. Because of Derrick PELLERITO Jr.'s past medical history and time since last visit, he/she will require a follow-up visit in order to better assess preoperative cardiovascular risk.  Pre-op covering staff: - Please schedule in office appointment and call patient to inform them. - Please contact requesting surgeon's office via preferred method (i.e, phone, fax) to inform them of need for appointment prior to surgery.  If applicable, this message will also be routed to pharmacy pool and/or primary cardiologist for input on holding anticoagulant/antiplatelet agent as requested below so that this information is available at time of patient's appointment.   Carie Charity, NP  11/03/2023, 7:59 AM

## 2023-11-03 NOTE — Telephone Encounter (Signed)
 Pt has been scheduled to see Charles Connor, NP, 11/09/23.  Clearance will be addressed at that time.  Will route to the requesting surgeon's office to make them aware.

## 2023-11-03 NOTE — Telephone Encounter (Signed)
   Pre-operative Risk Assessment    Patient Name: Derrick Craig.  DOB: [birth year] MRN: [id number]   Date of last office visit: 12/17/2021 Date of next office visit: N/A   Request for Surgical Clearance    Procedure:   COLONOSCOPY  Date of Surgery:  Clearance 11/14/23                                Surgeon:  DR. MANN Surgeon's Group or Practice Name:  [facility] Phone number:  [phone] Fax number:  [phone]   Type of Clearance Requested:   - Medical    Type of Anesthesia:   PROPOFOL    Additional requests/questions:    Berenda Breaker   11/03/2023, 7:41 AM

## 2023-11-07 NOTE — Progress Notes (Unsigned)
 Cardiology Office Note    Patient Name: Derrick Craig. Date of Encounter: 11/07/2023  Primary Care Provider:  Pete Brand, DO Primary Cardiologist:  None Primary Electrophysiologist: None   Past Medical History    Past Medical History:  Diagnosis Date   Atrial fibrillation (HCC) 02/28/2019   Glaucoma    Heart valve problem    History of kidney stones    MVP (mitral valve prolapse)    S/P minimally-invasive mitral valve repair 12/26/2018   Complex valvuloplasty including artificial Gore-tex neochord placement x10 and 36 mm Sorin Memo 4D ring annuloplasty via right mini thoracotomy approach   Severe mitral valve regurgitation     History of Present Illness  Derrick Burges. is a 63 y.o. male with a PMH of nonrheumatic MR s/p minimally invasive MVR, postop AF with no recurrence who presents today for preoperative clearance.  Derrick Craig was seen initially by Dr. Berry Bristol for management of mitral regurgitation.  He was noted to have murmur on exam for several years.  He underwent a 2D echo in 2020 that showed moderate to severe MR with EF of 60% moderately dilated LA with mild concentric hypertrophy and grade 3 DD.  He noticed progressive symptoms for several weeks more prominent with strenuous activity.  He underwent a LHC that showed normal coronaries and right heart pressures.  TEE was also completed and confirmed the presence of mitral valve prolapse that was moderate to severe.  He was evaluated by CVTS and underwent minimally invasive MVR by Dr. Alva Jewels 12/2018.  He developed postop AF and was treated with amiodarone  and metoprolol  for rate control.  He was started on Coumadin  which was monitored by Dr. Maida Sciara office.  He had amiodarone  and Coumadin  subsequently discontinued 2 months later.  2D echo completed 01/2019 showed EF of 55% with no residual MVR.  He was last seen by Dr. Berry Bristol on 12/17/2021 and continued to be asymptomatic with no recurrence of AF.  There were no  medication changes made at that visit.  Patient denies chest pain, palpitations, dyspnea, PND, orthopnea, nausea, vomiting, dizziness, syncope, edema, weight gain, or early satiety.   Discussed the use of AI scribe software for clinical note transcription with the patient, who gave verbal consent to proceed.  History of Present Illness    ***Notes: Scheduled for colonoscopy -Last ischemic evaluation:  Review of Systems  Please see the history of present illness.    All other systems reviewed and are otherwise negative except as noted above.  Physical Exam    Wt Readings from Last 3 Encounters:  12/17/21 193 lb 12.8 oz (87.9 kg)  04/11/20 195 lb (88.5 kg)  12/24/19 188 lb (85.3 kg)   ZO:XWRUE were no vitals filed for this visit.,There is no height or weight on file to calculate BMI. GEN: Well nourished, well developed in no acute distress Neck: No JVD; No carotid bruits Pulmonary: Clear to auscultation without rales, wheezing or rhonchi  Cardiovascular: Normal rate. Regular rhythm. Normal S1. Normal S2.   Murmurs: There is no murmur.  ABDOMEN: Soft, non-tender, non-distended EXTREMITIES:  No edema; No deformity   EKG/LABS/ Recent Cardiac Studies   ECG personally reviewed by me today - ***  Risk Assessment/Calculations:   {Does this patient have ATRIAL FIBRILLATION?:[phone]}      Lab Results  Component Value Date   WBC 8.5 12/30/2018   HGB 12.9 (L) 12/30/2018   HCT 38.2 (L) 12/30/2018   MCV 93.6 12/30/2018   PLT 210 12/30/2018  Lab Results  Component Value Date   CREATININE 0.96 12/30/2018   BUN 20 12/30/2018   NA 138 12/30/2018   K 4.3 12/30/2018   CL 100 12/30/2018   CO2 28 12/30/2018   No results found for: "CHOL", "HDL", "LDLCALC", "LDLDIRECT", "TRIG", "CHOLHDL"  Lab Results  Component Value Date   HGBA1C 5.2 12/22/2018   Assessment & Plan    1.  Preoperative clearance: - Patient's RCRI score is 0.4%  2.  Nonrheumatic MR: -s/p minimally  invasive MVR 12/2018 - SBE prophylaxis discussed  3.***  4.***      Disposition: Follow-up with None or APP in *** months {Are you ordering a CV Procedure (e.g. stress test, cath, DCCV, TEE, etc)?   Press F2        :[id number]}   Signed, Francene Ing, Retha Cast, NP 11/07/2023, 4:22 PM [facility]

## 2023-11-09 ENCOUNTER — Ambulatory Visit: Attending: Nurse Practitioner | Admitting: Nurse Practitioner

## 2023-11-09 ENCOUNTER — Encounter: Payer: Self-pay | Admitting: Nurse Practitioner

## 2023-11-09 VITALS — BP 126/64 | HR 82 | Ht 72.0 in | Wt 192.4 lb

## 2023-11-09 DIAGNOSIS — E7800 Pure hypercholesterolemia, unspecified: Secondary | ICD-10-CM

## 2023-11-09 DIAGNOSIS — I059 Rheumatic mitral valve disease, unspecified: Secondary | ICD-10-CM | POA: Diagnosis not present

## 2023-11-09 DIAGNOSIS — Z9889 Other specified postprocedural states: Secondary | ICD-10-CM | POA: Diagnosis not present

## 2023-11-09 DIAGNOSIS — Z01810 Encounter for preprocedural cardiovascular examination: Secondary | ICD-10-CM

## 2023-11-09 DIAGNOSIS — I4891 Unspecified atrial fibrillation: Secondary | ICD-10-CM

## 2023-11-09 DIAGNOSIS — I9789 Other postprocedural complications and disorders of the circulatory system, not elsewhere classified: Secondary | ICD-10-CM

## 2023-11-09 NOTE — Patient Instructions (Signed)
 Medication Instructions:  Your physician recommends that you continue on your current medications as directed. Please refer to the Current Medication list given to you today. *If you need a refill on your cardiac medications before your next appointment, please call your pharmacy*  Lab Work: None ordered If you have labs (blood work) drawn today and your tests are completely normal, you will receive your results only by: MyChart Message (if you have MyChart) OR A paper copy in the mail If you have any lab test that is abnormal or we need to change your treatment, we will call you to review the results.  Testing/Procedures: Your physician has requested that you have an echocardiogram. Echocardiography is a painless test that uses sound waves to create images of your heart. It provides your doctor with information about the size and shape of your heart and how well your heart's chambers and valves are working. This procedure takes approximately one hour. There are no restrictions for this procedure. Please do NOT wear cologne, perfume, aftershave, or lotions (deodorant is allowed). Please arrive 15 minutes prior to your appointment time.  Please note: We ask at that you not bring children with you during ultrasound (echo/ vascular) testing. Due to room size and safety concerns, children are not allowed in the ultrasound rooms during exams. Our front office staff cannot provide observation of children in our lobby area while testing is being conducted. An adult accompanying a patient to their appointment will only be allowed in the ultrasound room at the discretion of the ultrasound technician under special circumstances. We apologize for any inconvenience.  Follow-Up: At [facility], you and your health needs are our priority.  As part of our continuing mission to provide you with exceptional heart care, our providers are all part of one team.  This team includes your primary Cardiologist  (physician) and Advanced Practice Providers or APPs (Physician Assistants and Nurse Practitioners) who all work together to provide you with the care you need, when you need it.  Your next appointment:   24 month(s)  Provider:   Knox Perl, MD   We recommend signing up for the patient portal called "MyChart".  Sign up information is provided on this After Visit Summary.  MyChart is used to connect with patients for Virtual Visits (Telemedicine).  Patients are able to view lab/test results, encounter notes, upcoming appointments, etc.  Non-urgent messages can be sent to your provider as well.   To learn more about what you can do with MyChart, go to [email or web address].   Other Instructions YOU ARE CLEARED FOR YOUR UPCOMING PROCEDURE

## 2023-11-14 DIAGNOSIS — K648 Other hemorrhoids: Secondary | ICD-10-CM | POA: Diagnosis not present

## 2023-11-14 DIAGNOSIS — K5730 Diverticulosis of large intestine without perforation or abscess without bleeding: Secondary | ICD-10-CM | POA: Diagnosis not present

## 2023-11-14 DIAGNOSIS — Z1211 Encounter for screening for malignant neoplasm of colon: Secondary | ICD-10-CM | POA: Diagnosis not present

## 2023-11-14 DIAGNOSIS — Z860102 Personal history of hyperplastic colon polyps: Secondary | ICD-10-CM | POA: Diagnosis not present

## 2023-12-09 ENCOUNTER — Encounter (HOSPITAL_COMMUNITY): Payer: Self-pay | Admitting: Nurse Practitioner

## 2023-12-09 ENCOUNTER — Ambulatory Visit (HOSPITAL_COMMUNITY): Attending: Cardiology

## 2024-01-12 ENCOUNTER — Emergency Department (HOSPITAL_COMMUNITY)

## 2024-01-12 ENCOUNTER — Emergency Department (HOSPITAL_COMMUNITY)
Admission: EM | Admit: 2024-01-12 | Discharge: 2024-01-13 | Disposition: A | Discharge status: Home / Self Care | Attending: Emergency Medicine | Admitting: Emergency Medicine

## 2024-01-12 ENCOUNTER — Encounter (HOSPITAL_COMMUNITY): Payer: Self-pay

## 2024-01-12 ENCOUNTER — Other Ambulatory Visit: Payer: Self-pay

## 2024-01-12 DIAGNOSIS — N200 Calculus of kidney: Secondary | ICD-10-CM

## 2024-01-12 DIAGNOSIS — Z7982 Long term (current) use of aspirin: Secondary | ICD-10-CM | POA: Diagnosis not present

## 2024-01-12 DIAGNOSIS — Z7901 Long term (current) use of anticoagulants: Secondary | ICD-10-CM | POA: Insufficient documentation

## 2024-01-12 DIAGNOSIS — N201 Calculus of ureter: Secondary | ICD-10-CM | POA: Diagnosis not present

## 2024-01-12 DIAGNOSIS — R109 Unspecified abdominal pain: Secondary | ICD-10-CM | POA: Diagnosis not present

## 2024-01-12 DIAGNOSIS — N400 Enlarged prostate without lower urinary tract symptoms: Secondary | ICD-10-CM | POA: Diagnosis not present

## 2024-01-12 DIAGNOSIS — N202 Calculus of kidney with calculus of ureter: Secondary | ICD-10-CM | POA: Diagnosis not present

## 2024-01-12 LAB — CBC WITH DIFFERENTIAL/PLATELET
Abs Immature Granulocytes: 0.03 10*3/uL (ref 0.00–0.07)
Basophils Absolute: 0.1 10*3/uL (ref 0.0–0.1)
Basophils Relative: 1 %
Eosinophils Absolute: 0.1 10*3/uL (ref 0.0–0.5)
Eosinophils Relative: 1 %
HCT: 48.5 % (ref 39.0–52.0)
Hemoglobin: 16.5 g/dL (ref 13.0–17.0)
Immature Granulocytes: 0 %
Lymphocytes Relative: 12 %
Lymphs Abs: 1.1 10*3/uL (ref 0.7–4.0)
MCH: 32 pg (ref 26.0–34.0)
MCHC: 34 g/dL (ref 30.0–36.0)
MCV: 94.2 fL (ref 80.0–100.0)
Monocytes Absolute: 0.5 10*3/uL (ref 0.1–1.0)
Monocytes Relative: 6 %
Neutro Abs: 7 10*3/uL (ref 1.7–7.7)
Neutrophils Relative %: 80 %
Platelets: 245 10*3/uL (ref 150–400)
RBC: 5.15 MIL/uL (ref 4.22–5.81)
RDW: 12.4 % (ref 11.5–15.5)
WBC: 8.7 10*3/uL (ref 4.0–10.5)
nRBC: 0 % (ref 0.0–0.2)

## 2024-01-12 LAB — URINALYSIS, ROUTINE W REFLEX MICROSCOPIC
Bacteria, UA: NONE SEEN
Bilirubin Urine: NEGATIVE
Glucose, UA: NEGATIVE mg/dL
Ketones, ur: 5 mg/dL — AB
Leukocytes,Ua: NEGATIVE
Nitrite: NEGATIVE
Protein, ur: NEGATIVE mg/dL
Specific Gravity, Urine: 1.015 (ref 1.005–1.030)
pH: 5 (ref 5.0–8.0)

## 2024-01-12 LAB — COMPREHENSIVE METABOLIC PANEL WITH GFR
ALT: 21 U/L (ref 0–44)
AST: 22 U/L (ref 15–41)
Albumin: 4.3 g/dL (ref 3.5–5.0)
Alkaline Phosphatase: 71 U/L (ref 38–126)
Anion gap: 11 (ref 5–15)
BUN: 18 mg/dL (ref 6–20)
CO2: 24 mmol/L (ref 22–32)
Calcium: 8.8 mg/dL — ABNORMAL LOW (ref 8.9–10.3)
Chloride: 102 mmol/L (ref 98–111)
Creatinine, Ser: 1.15 mg/dL (ref 0.61–1.24)
GFR, Estimated: >60 mL/min (ref >60)
Glucose, Bld: 92 mg/dL (ref 70–99)
Potassium: 3.7 mmol/L (ref 3.5–5.1)
Sodium: 137 mmol/L (ref 135–145)
Total Bilirubin: 1.8 mg/dL — ABNORMAL HIGH (ref 0.0–1.2)
Total Protein: 7.1 g/dL (ref 6.5–8.1)

## 2024-01-12 MED ORDER — OXYCODONE-ACETAMINOPHEN 5-325 MG PO TABS: 1.0000 | ORAL_TABLET | Freq: Three times a day (TID) | ORAL | 0 refills | Status: DC | PRN

## 2024-01-12 MED ORDER — KETOROLAC TROMETHAMINE 15 MG/ML IJ SOLN
15.0000 mg | Freq: Once | INTRAMUSCULAR | Status: AC
  Administered 2024-01-13: 15 mg via INTRAMUSCULAR
  Filled 2024-01-12: qty 1

## 2024-01-12 NOTE — ED Provider Notes (Addendum)
 [place] EMERGENCY DEPARTMENT AT [facility] Provider Note   CSN: [id number] Arrival date & time: 01/12/24  1635     Patient presents with: Flank Pain   Derrick Craig. is a 63 y.o. male.    Flank Pain  Patient presents with right-sided flank pain.  Began this morning.  Now more in the lower abdomen.  Has waxed and waned somewhat.  Had improved somewhat with Tylenol .  Has a previous history of kidney stones but is never passed from.    Past Medical History:  Diagnosis Date   Atrial fibrillation (HCC) 02/28/2019   Glaucoma    Heart valve problem    History of kidney stones    MVP (mitral valve prolapse)    S/P minimally-invasive mitral valve repair 12/26/2018   Complex valvuloplasty including artificial Gore-tex neochord placement x10 and 36 mm Sorin Memo 4D ring annuloplasty via right mini thoracotomy approach   Severe mitral valve regurgitation     Prior to Admission medications   Medication Sig Start Date End Date Taking? Authorizing Provider  oxyCODONE -acetaminophen  (PERCOCET/ROXICET) 5-325 MG tablet Take 1-2 tablets by mouth every 8 (eight) hours as needed for severe pain (pain score 7-10). 01/12/24  Yes Patsey Lot, MD  aspirin  EC 81 MG EC tablet Take 1 tablet (81 mg total) by mouth daily. Patient not taking: Reported on 11/09/2023 01/01/19   Gold, Wayne E, PA-C  latanoprost  (XALATAN ) 0.005 % ophthalmic solution Place 1 drop into both eyes at bedtime.    [provider]  timolol  (TIMOPTIC ) 0.5 % ophthalmic solution Place 1 drop into the left eye daily.  05/29/18   [provider]    Allergies: Patient has no known allergies.    Review of Systems  Genitourinary:  Positive for flank pain.    Updated Vital Signs BP (!) 149/93   Pulse 70   Temp 97.6 F (36.4 C)   Resp 19   Ht 6' (1.829 m)   Wt 87.3 kg   SpO2 100%   BMI 26.10 kg/m   Physical Exam Vitals and nursing note reviewed.  HENT:     Head: Normocephalic.   Cardiovascular:     Rate and Rhythm: Regular rhythm.  Abdominal:     Tenderness: There is no abdominal tenderness.  Genitourinary:    Comments:  no testicular tenderness.  No hernia palpated.  No CVA tenderness. Skin:    Capillary Refill: Capillary refill takes less than 2 seconds.  Neurological:     Mental Status: He is alert.     (all labs ordered are listed, but only abnormal results are displayed) Labs Reviewed  URINALYSIS, ROUTINE W REFLEX MICROSCOPIC - Abnormal; Notable for the following components:      Result Value   Hgb urine dipstick SMALL (*)    Ketones, ur 5 (*)    All other components within normal limits  COMPREHENSIVE METABOLIC PANEL WITH GFR - Abnormal; Notable for the following components:   Calcium 8.8 (*)    Total Bilirubin 1.8 (*)    All other components within normal limits  CBC WITH DIFFERENTIAL/PLATELET    EKG: None  Radiology: CT Renal Stone Study Result Date: 01/13/2024 CLINICAL DATA:  Abdominal/flank pain, stone suspected. R sided flank pain since this AM. H/x kidney stones EXAM: CT ABDOMEN AND PELVIS WITHOUT CONTRAST TECHNIQUE: Multidetector CT imaging of the abdomen and pelvis was performed following the standard protocol without IV contrast. RADIATION DOSE REDUCTION: This exam was performed according to the departmental dose-optimization  program which includes automated exposure control, adjustment of the mA and/or kV according to patient size and/or use of iterative reconstruction technique. COMPARISON:  CT angio abdomen pelvis 12/07/2018 FINDINGS: Lower chest: Mitral annular valve replacement. Hepatobiliary: No focal liver abnormality. No gallstones, gallbladder wall thickening, or pericholecystic fluid. No biliary dilatation. Pancreas: No focal lesion. Normal pancreatic contour. No surrounding inflammatory changes. No main pancreatic ductal dilatation. Spleen: Normal in size without focal abnormality. Adrenals/Urinary Tract: No adrenal nodule  bilaterally. Left nephrolithiasis measuring up to 4 mm. Right punctate nephrolithiasis. 5 mm calcified stone at the right ureterovesicular junction. Associated mild fullness of the right collecting system. No left hydroureteronephrosis. The urinary bladder is unremarkable. Stomach/Bowel: Stomach is within normal limits. No evidence of bowel wall thickening or dilatation. Appendix appears normal. Vascular/Lymphatic: No abdominal aorta or iliac aneurysm. No abdominal, pelvic, or inguinal lymphadenopathy. Reproductive: Prostate enlarged measuring up to 5.5 cm. Other: No intraperitoneal free fluid. No intraperitoneal free gas. No organized fluid collection. Musculoskeletal: No abdominal wall hernia or abnormality. No suspicious lytic or blastic osseous lesions. No acute displaced fracture. IMPRESSION: 1. Obstructive 5 mm right ureterovesicular junction stone. 2. Nonobstructive bilateral nephrolithiasis. 3. Prostatomegaly. Electronically Signed   By: Morgane  Naveau M.D.   On: 01/13/2024 00:07     Procedures   Medications Ordered in the ED  ketorolac  (TORADOL ) 15 MG/ML injection 15 mg (has no administration in time range)                                    Medical Decision Making Amount and/or Complexity of Data Reviewed Labs: ordered. Radiology: ordered.  Risk Prescription drug management.   Patient with abdominal pain/flank pain.  Began earlier today.  Differential diagnosis includes causes but hide in the differential has to be kidney stone.  Has had previous hematuria.  Has had known kidney stones.  No fevers.  Causes such as aortic dissection or AAA felt less likely.  Blood work reassuring.  Urine does have some hematuria.  Will get CT scan to evaluate.  Care turned over to Dr Raford  CT scan reviewed and shows likely stone either distally in the ureter or in the bladder.  Awaiting official read.  CT scan does show UVJ stone.  Will discharge home.    Final diagnoses:  Kidney stone     ED Discharge Orders          Ordered    oxyCODONE -acetaminophen  (PERCOCET/ROXICET) 5-325 MG tablet  Every 8 hours PRN        01/12/24 2339               Patsey Lot, MD 01/12/24 7671    Patsey Lot, MD 01/12/24 2340    Patsey Lot, MD 01/13/24 0010

## 2024-01-12 NOTE — ED Triage Notes (Signed)
 Pt reports R sided flank pain since this AM. H/x kidney stones

## 2024-01-13 ENCOUNTER — Telehealth (HOSPITAL_COMMUNITY): Payer: Self-pay | Admitting: Emergency Medicine

## 2024-01-13 MED ORDER — OXYCODONE-ACETAMINOPHEN 5-325 MG PO TABS: 1.0000 | ORAL_TABLET | Freq: Four times a day (QID) | ORAL | 0 refills | Status: DC | PRN

## 2024-01-13 NOTE — Telephone Encounter (Signed)
 Patients pharmacy is closed today. Requested that pain medicine be sent to open pharmacy which was completed after chart review.

## 2024-01-16 DIAGNOSIS — R350 Frequency of micturition: Secondary | ICD-10-CM | POA: Diagnosis not present

## 2024-01-16 DIAGNOSIS — N201 Calculus of ureter: Secondary | ICD-10-CM | POA: Diagnosis not present

## 2024-01-16 DIAGNOSIS — N132 Hydronephrosis with renal and ureteral calculous obstruction: Secondary | ICD-10-CM | POA: Diagnosis not present

## 2024-01-16 DIAGNOSIS — N401 Enlarged prostate with lower urinary tract symptoms: Secondary | ICD-10-CM | POA: Diagnosis not present

## 2024-01-17 DIAGNOSIS — Z23 Encounter for immunization: Secondary | ICD-10-CM | POA: Diagnosis not present

## 2024-01-26 ENCOUNTER — Ambulatory Visit (HOSPITAL_COMMUNITY)
Admission: RE | Admit: 2024-01-26 | Discharge: 2024-01-26 | Disposition: A | Discharge status: Home / Self Care | Source: Ambulatory Visit | Attending: Cardiology | Admitting: Cardiology

## 2024-01-26 DIAGNOSIS — I059 Rheumatic mitral valve disease, unspecified: Secondary | ICD-10-CM

## 2024-01-26 DIAGNOSIS — Z9889 Other specified postprocedural states: Secondary | ICD-10-CM

## 2024-01-26 DIAGNOSIS — Z01810 Encounter for preprocedural cardiovascular examination: Secondary | ICD-10-CM

## 2024-01-26 DIAGNOSIS — I9789 Other postprocedural complications and disorders of the circulatory system, not elsewhere classified: Secondary | ICD-10-CM | POA: Diagnosis not present

## 2024-01-26 DIAGNOSIS — I4891 Unspecified atrial fibrillation: Secondary | ICD-10-CM | POA: Diagnosis not present

## 2024-01-26 LAB — ECHOCARDIOGRAM COMPLETE
Area-P 1/2: 3.48 cm2
S' Lateral: 4 cm

## 2024-01-27 ENCOUNTER — Ambulatory Visit: Payer: Self-pay | Admitting: Nurse Practitioner

## 2024-01-31 DIAGNOSIS — H401222 Low-tension glaucoma, left eye, moderate stage: Secondary | ICD-10-CM | POA: Diagnosis not present

## 2024-02-09 DIAGNOSIS — N132 Hydronephrosis with renal and ureteral calculous obstruction: Secondary | ICD-10-CM | POA: Diagnosis not present

## 2024-02-09 DIAGNOSIS — R350 Frequency of micturition: Secondary | ICD-10-CM | POA: Diagnosis not present

## 2024-02-09 DIAGNOSIS — N201 Calculus of ureter: Secondary | ICD-10-CM | POA: Diagnosis not present

## 2024-02-09 DIAGNOSIS — N401 Enlarged prostate with lower urinary tract symptoms: Secondary | ICD-10-CM | POA: Diagnosis not present

## 2024-03-15 ENCOUNTER — Other Ambulatory Visit (HOSPITAL_COMMUNITY): Payer: Self-pay

## 2024-03-15 ENCOUNTER — Encounter: Payer: Self-pay | Admitting: Cardiology

## 2024-03-15 ENCOUNTER — Ambulatory Visit: Attending: Cardiology | Admitting: Cardiology

## 2024-03-15 VITALS — BP 130/74 | HR 71 | Resp 16 | Ht 72.0 in | Wt 186.6 lb

## 2024-03-15 DIAGNOSIS — I349 Nonrheumatic mitral valve disorder, unspecified: Secondary | ICD-10-CM

## 2024-03-15 DIAGNOSIS — I059 Rheumatic mitral valve disease, unspecified: Secondary | ICD-10-CM

## 2024-03-15 DIAGNOSIS — E7800 Pure hypercholesterolemia, unspecified: Secondary | ICD-10-CM

## 2024-03-15 DIAGNOSIS — I428 Other cardiomyopathies: Secondary | ICD-10-CM

## 2024-03-15 DIAGNOSIS — Z9889 Other specified postprocedural states: Secondary | ICD-10-CM

## 2024-03-15 DIAGNOSIS — Z2989 Encounter for other specified prophylactic measures: Secondary | ICD-10-CM

## 2024-03-15 MED ORDER — ATORVASTATIN CALCIUM 10 MG PO TABS
10.0000 mg | ORAL_TABLET | Freq: Every day | ORAL | 3 refills | Status: DC
  Filled 2024-03-15: qty 90, 90d supply, fill #0

## 2024-03-15 MED ORDER — ATORVASTATIN CALCIUM 10 MG PO TABS: 10.0000 mg | ORAL_TABLET | Freq: Every day | ORAL | 3 refills | Status: AC

## 2024-03-15 NOTE — Patient Instructions (Addendum)
 Medication Instructions:  START Lipitor 10 mg daily  *If you need a refill on your cardiac medications before your next appointment, please call your pharmacy*  Lab Work: NONE If you have labs (blood work) drawn today and your tests are completely normal, you will receive your results only by: MyChart Message (if you have MyChart) OR A paper copy in the mail If you have any lab test that is abnormal or we need to change your treatment, we will call you to review the results.  Testing/Procedures: ECHOCARDIOGRAM IN 6 MONTHS Your physician has requested that you have an echocardiogram. Echocardiography is a painless test that uses sound waves to create images of your heart. It provides your doctor with information about the size and shape of your heart and how well your heart's chambers and valves are working. This procedure takes approximately one hour. There are no restrictions for this procedure. Please do NOT wear cologne, perfume, aftershave, or lotions (deodorant is allowed). Please arrive 15 minutes prior to your appointment time.  Please note: We ask at that you not bring children with you during ultrasound (echo/ vascular) testing. Due to room size and safety concerns, children are not allowed in the ultrasound rooms during exams. Our front office staff cannot provide observation of children in our lobby area while testing is being conducted. An adult accompanying a patient to their appointment will only be allowed in the ultrasound room at the discretion of the ultrasound technician under special circumstances. We apologize for any inconvenience.    Follow-Up: At [facility], you and your health needs are our priority.  As part of our continuing mission to provide you with exceptional heart care, our providers are all part of one team.  This team includes your primary Cardiologist (physician) and Advanced Practice Providers or APPs (Physician Assistants and Nurse Practitioners)  who all work together to provide you with the care you need, when you need it.  Your next appointment:   6 Months   Provider:   Gordy Bergamo, MD    We recommend signing up for the patient portal called MyChart.  Sign up information is provided on this After Visit Summary.  MyChart is used to connect with patients for Virtual Visits (Telemedicine).  Patients are able to view lab/test results, encounter notes, upcoming appointments, etc.  Non-urgent messages can be sent to your provider as well.   To learn more about what you can do with MyChart, go to [email or web address].

## 2024-03-15 NOTE — Progress Notes (Signed)
 Cardiology Office Note:  .   Date:  03/15/2024  ID:  Prentiss FORBES Derrick Craig., DOB 12/14/1960, MRN [id number] PCP: Gerome Brunet, DO  [facility] Providers Cardiologist:  Gordy Bergamo, MD   History of Present Illness: .   Derrick Craig. is a 63 y.o. Caucasian male with history of mitral valve prolapse and moderate to severe MR, underwent minimally invasive mitral valve repair on 12/26/2018 by Dr. Sudie Laine.  He was seen for preoperative evaluation for upcoming colonoscopy and echocardiogram was ordered for follow-up.  Echocardiogram on 717 revealed LVEF to be 40 to 45% from previous 50 to 55% however contrast was not utilized.  Patient remains completely asymptomatic.  Concerned about mild aortic valve calcification.  Cardiac Studies relevent.    ECHOCARDIOGRAM COMPLETE 01/26/2024  1. Left ventricular ejection fraction, by estimation, is 40 to 45%. The left ventricle has normal function. The left ventricle demonstrates global hypokinesis. Left ventricular diastolic parameters are consistent with Grade I diastolic dysfunction (impaired relaxation). 2. Right ventricular systolic function is mildly reduced. The right ventricular size is normal. 3. The mitral valve is s/p valvuloplasty with 36mm Sorin Memo 4D ring and 10x neochord placement.    Discussed the use of AI scribe software for clinical note transcription with the patient, who gave verbal consent to proceed.  History of Present Illness Demeco Craig. is a 63 year old male with a history of mitral valve surgery who presents for cardiovascular follow-up.  He underwent mitral valve surgery in 2020. An echocardiogram from July 2025 shows calcium  buildup on the aortic valve. A heart catheterization in 2020 revealed no blockages.  He experiences sharp pain in his left arm, radiating to the shoulder, lasting 30 to 40 minutes. This occurs sporadically, sometimes at rest, and began post-surgery.  Cholesterol levels from  February 2025 are total cholesterol 179 mg/dL, triglycerides 70 mg/dL, HDL 52 mg/dL, and LDL 885 mg/dL. He is not on cholesterol medication.   Labs    Recent Labs    01/12/24 2204  NA 137  K 3.7  CL 102  CO2 24  GLUCOSE 92  BUN 18  CREATININE 1.15  CALCIUM  8.8*  GFRNONAA >60    Lab Results  Component Value Date   ALT 21 01/12/2024   AST 22 01/12/2024   ALKPHOS 71 01/12/2024   BILITOT 1.8 (H) 01/12/2024      Latest Ref Rng & Units 01/12/2024   10:04 PM 12/30/2018    7:54 AM 12/29/2018    5:04 AM  CBC  WBC 4.0 - 10.5 K/uL 8.7  8.5  11.9   Hemoglobin 13.0 - 17.0 g/dL 83.4  87.0  87.0   Hematocrit 39.0 - 52.0 % 48.5  38.2  37.9   Platelets 150 - 400 K/uL 245  210  206    Care everywhere/Faxed External Labs:  09/09/2023:  TC 179, TG 70, HDL 52, LDL 114  ROS  Review of Systems  Cardiovascular:  Negative for chest pain, dyspnea on exertion and leg swelling.   Physical Exam:   VS:  BP 130/74 (BP Location: Left Arm, Patient Position: Sitting, Cuff Size: Normal)   Pulse 71   Resp 16   Ht 6' (1.829 m)   Wt 186 lb 9.6 oz (84.6 kg)   SpO2 99%   BMI 25.31 kg/m    Wt Readings from Last 3 Encounters:  03/15/24 186 lb 9.6 oz (84.6 kg)  01/12/24 192 lb 7.4 oz (87.3 kg)  11/09/23 192  lb 6.4 oz (87.3 kg)    BP Readings from Last 3 Encounters:  03/15/24 130/74  01/13/24 (!) 149/93  11/09/23 126/64   Physical Exam Neck:     Vascular: No carotid bruit or JVD.  Cardiovascular:     Rate and Rhythm: Normal rate and regular rhythm.     Pulses: Intact distal pulses.     Heart sounds: Normal heart sounds. No murmur heard.    No gallop.  Pulmonary:     Effort: Pulmonary effort is normal.     Breath sounds: Normal breath sounds.  Abdominal:     General: Bowel sounds are normal.     Palpations: Abdomen is soft.  Musculoskeletal:     Right lower leg: No edema.     Left lower leg: No edema.    EKG:         ASSESSMENT AND PLAN: .      ICD-10-CM   1. Mitral valve  disorder  I05.9 ECHOCARDIOGRAM COMPLETE    2. S/P mitral valve repair  Z98.890 ECHOCARDIOGRAM COMPLETE    3. Non-ischemic cardiomyopathy (HCC)  I42.8 ECHOCARDIOGRAM COMPLETE    4. Need for SBE (subacute bacterial endocarditis) prophylaxis  Z29.89     5. Mild hypercholesterolemia  E78.00 atorvastatin  (LIPITOR) 10 MG tablet    DISCONTINUED: atorvastatin  (LIPITOR) 10 MG tablet     Assessment & Plan Status post mitral valve replacement with preserved prosthetic valve function Mitral valve replacement performed in 2020 with preserved prosthetic valve function. Current echocardiogram shows mild decrease in heart function to 40-45%, which is new. Belief that this may be an error due to lack of contrast agent during imaging. No concerns about the mitral valve function itself. - Repeat echocardiogram in 6 months with contrast to assess wall motion abnormality - Ensure antibiotics are administered before any dental procedures  Mildly reduced left ventricular systolic function Echocardiogram indicates mildly reduced left ventricular systolic function with ejection fraction at 40-45%. This is a new finding since the valve surgery. Belief that the reduction may be due to imaging error as no contrast was used. Clinical exam is normal. - Repeat echocardiogram in 6 months with contrast to confirm findings  Aortic valve calcification Presence of calcium  buildup on the aortic valve noted, which is common with aging. The aortic valve is currently opening well, and there is no immediate concern regarding this finding. - Recommend statin therapy  Hypercholesterolemia Cholesterol levels: total cholesterol 179 mg/dL, triglycerides 70 mg/dL, HDL 52 mg/dL, LDL 885 mg/dL. Recommendation to start cholesterol-lowering medication to prevent further aortic valve calcification and reduce LDL levels below 100 mg/dL, ideally closer to 70 mg/dL. Lipitor 10 mg daily is initiated, which is expected to lower cholesterol  significantly. The most common side effect is muscle aches, which are usually mild. If severe, alternative medications can be considered. - Initiate Lipitor 10 mg daily - Discontinue aspirin  as there is no indication for its use - Lipid started in view of aortic valve calcification.  Left arm and shoulder neuropathic pain Intermittent sharp pain in the left arm and shoulder, lasting 30-40 minutes, occurring without exertion. Likely neuropathic pain or muscle spasm rather than cardiac in origin, as the pain does not present with typical cardiac characteristics.   Follow up: 6 months for follow-up of LVEF.  I did not make any changes to his medications as he remains completely asymptomatic.  Signed,  Gordy Bergamo, MD, [facility] 03/15/2024, 4:34 PM [facility] [address] [place], [place] [address] Phone: [phone].  Fax:  [phone]

## 2024-04-23 ENCOUNTER — Ambulatory Visit (HOSPITAL_COMMUNITY)
Admission: RE | Admit: 2024-04-23 | Discharge: 2024-04-23 | Disposition: A | Discharge status: Home / Self Care | Source: Ambulatory Visit | Attending: Cardiovascular Disease | Admitting: Cardiovascular Disease

## 2024-04-23 DIAGNOSIS — I428 Other cardiomyopathies: Secondary | ICD-10-CM

## 2024-04-23 DIAGNOSIS — Z9889 Other specified postprocedural states: Secondary | ICD-10-CM

## 2024-04-23 DIAGNOSIS — I059 Rheumatic mitral valve disease, unspecified: Secondary | ICD-10-CM

## 2024-05-03 DIAGNOSIS — L821 Other seborrheic keratosis: Secondary | ICD-10-CM | POA: Diagnosis not present

## 2024-05-03 DIAGNOSIS — Z85828 Personal history of other malignant neoplasm of skin: Secondary | ICD-10-CM | POA: Diagnosis not present

## 2024-05-03 DIAGNOSIS — L814 Other melanin hyperpigmentation: Secondary | ICD-10-CM | POA: Diagnosis not present

## 2024-05-03 DIAGNOSIS — L723 Sebaceous cyst: Secondary | ICD-10-CM | POA: Diagnosis not present

## 2024-05-03 DIAGNOSIS — L570 Actinic keratosis: Secondary | ICD-10-CM | POA: Diagnosis not present

## 2024-09-11 ENCOUNTER — Ambulatory Visit (HOSPITAL_COMMUNITY)

## 2024-10-02 ENCOUNTER — Ambulatory Visit: Admitting: Cardiology
